# Patient Record
Sex: Female | Born: 1990 | Race: Black or African American | Hispanic: No | Marital: Single | Smoking: Never smoker
Health system: Southern US, Community
[De-identification: ages and names within clinical notes are randomized; demographics above are authoritative.]

## PROBLEM LIST (undated history)

## (undated) DIAGNOSIS — Z8619 Personal history of other infectious and parasitic diseases: Secondary | ICD-10-CM

## (undated) HISTORY — DX: Personal history of other infectious and parasitic diseases: Z86.19

## (undated) HISTORY — PX: NO PAST SURGERIES: SHX2092

---
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
## 2009-02-24 ENCOUNTER — Ambulatory Visit: Payer: Self-pay | Admitting: Internal Medicine

## 2009-02-26 ENCOUNTER — Ambulatory Visit: Payer: Self-pay | Admitting: Internal Medicine

## 2009-12-22 ENCOUNTER — Emergency Department (HOSPITAL_COMMUNITY): Admission: EM | Admit: 2009-12-22 | Discharge: 2009-12-22 | Payer: Self-pay | Admitting: Family Medicine

## 2010-05-13 LAB — URINALYSIS, ROUTINE W REFLEX MICROSCOPIC
Bilirubin Urine: NEGATIVE
Glucose, UA: NEGATIVE mg/dL
Hgb urine dipstick: NEGATIVE
Ketones, ur: NEGATIVE mg/dL
Nitrite: NEGATIVE
Protein, ur: NEGATIVE mg/dL
Specific Gravity, Urine: 1.021 (ref 1.005–1.030)
Urobilinogen, UA: 0.2 mg/dL (ref 0.0–1.0)
pH: 8 (ref 5.0–8.0)

## 2010-05-13 LAB — WET PREP, GENITAL
Trich, Wet Prep: NONE SEEN
Yeast Wet Prep HPF POC: NONE SEEN

## 2010-05-13 LAB — GC/CHLAMYDIA PROBE AMP, GENITAL
Chlamydia, DNA Probe: NEGATIVE
GC Probe Amp, Genital: NEGATIVE

## 2010-05-13 LAB — POCT PREGNANCY, URINE: Preg Test, Ur: NEGATIVE

## 2010-05-13 LAB — URINE MICROSCOPIC-ADD ON

## 2010-05-14 LAB — URINE CULTURE: Colony Count: 15000

## 2010-05-14 LAB — WET PREP, GENITAL
Trich, Wet Prep: NONE SEEN
Yeast Wet Prep HPF POC: NONE SEEN

## 2010-05-14 LAB — URINALYSIS, ROUTINE W REFLEX MICROSCOPIC
Bilirubin Urine: NEGATIVE
Bilirubin Urine: NEGATIVE
Glucose, UA: NEGATIVE mg/dL
Glucose, UA: NEGATIVE mg/dL
Hgb urine dipstick: NEGATIVE
Hgb urine dipstick: NEGATIVE
Ketones, ur: NEGATIVE mg/dL
Ketones, ur: NEGATIVE mg/dL
Nitrite: NEGATIVE
Nitrite: NEGATIVE
Protein, ur: NEGATIVE mg/dL
Protein, ur: NEGATIVE mg/dL
Specific Gravity, Urine: 1.016 (ref 1.005–1.030)
Specific Gravity, Urine: 1.025 (ref 1.005–1.030)
Urobilinogen, UA: 1 mg/dL (ref 0.0–1.0)
Urobilinogen, UA: 1 mg/dL (ref 0.0–1.0)
pH: 8 (ref 5.0–8.0)
pH: 7 (ref 5.0–8.0)

## 2010-05-14 LAB — URINE MICROSCOPIC-ADD ON

## 2010-05-14 LAB — GC/CHLAMYDIA PROBE AMP, GENITAL
Chlamydia, DNA Probe: POSITIVE — AB
GC Probe Amp, Genital: NEGATIVE

## 2010-05-14 LAB — POCT PREGNANCY, URINE: Preg Test, Ur: NEGATIVE

## 2010-05-23 LAB — POCT URINALYSIS DIP (DEVICE)
Glucose, UA: NEGATIVE mg/dL
Hgb urine dipstick: NEGATIVE
Nitrite: NEGATIVE
Protein, ur: 100 mg/dL — AB
Specific Gravity, Urine: 1.025 (ref 1.005–1.030)
Urobilinogen, UA: 0.2 mg/dL (ref 0.0–1.0)
pH: 5.5 (ref 5.0–8.0)

## 2010-05-23 LAB — POCT I-STAT, CHEM 8
BUN: 9 mg/dL (ref 6–23)
Calcium, Ion: 1.22 mmol/L (ref 1.12–1.32)
Chloride: 105 mEq/L (ref 96–112)
Creatinine, Ser: 0.8 mg/dL (ref 0.4–1.2)
Glucose, Bld: 95 mg/dL (ref 70–99)
HCT: 43 % (ref 36.0–46.0)
Hemoglobin: 14.6 g/dL (ref 12.0–15.0)
Potassium: 4.1 mEq/L (ref 3.5–5.1)
Sodium: 142 mEq/L (ref 135–145)
TCO2: 27 mmol/L (ref 0–100)

## 2010-05-23 LAB — POCT PREGNANCY, URINE: Preg Test, Ur: NEGATIVE

## 2010-05-24 LAB — POCT I-STAT, CHEM 8
BUN: 6 mg/dL (ref 6–23)
Calcium, Ion: 1.15 mmol/L (ref 1.12–1.32)
Chloride: 104 mEq/L (ref 96–112)
Creatinine, Ser: 0.9 mg/dL (ref 0.4–1.2)
Glucose, Bld: 83 mg/dL (ref 70–99)
HCT: 39 % (ref 36.0–46.0)
Hemoglobin: 13.3 g/dL (ref 12.0–15.0)
Potassium: 3.5 mEq/L (ref 3.5–5.1)
Sodium: 140 mEq/L (ref 135–145)
TCO2: 25 mmol/L (ref 0–100)

## 2010-05-24 LAB — CBC
HCT: 40.3 % (ref 36.0–46.0)
Hemoglobin: 13.2 g/dL (ref 12.0–15.0)
MCHC: 32.8 g/dL (ref 30.0–36.0)
MCV: 96 fL (ref 78.0–100.0)
Platelets: 141 10*3/uL — ABNORMAL LOW (ref 150–400)
RBC: 4.2 MIL/uL (ref 3.87–5.11)
RDW: 13.4 % (ref 11.5–15.5)
WBC: 5.9 10*3/uL (ref 4.0–10.5)

## 2010-05-24 LAB — COMPREHENSIVE METABOLIC PANEL
ALT: 15 U/L (ref 0–35)
AST: 18 U/L (ref 0–37)
Albumin: 4.1 g/dL (ref 3.5–5.2)
Alkaline Phosphatase: 51 U/L (ref 39–117)
BUN: 6 mg/dL (ref 6–23)
CO2: 26 mEq/L (ref 19–32)
Calcium: 9.5 mg/dL (ref 8.4–10.5)
Chloride: 107 mEq/L (ref 96–112)
Creatinine, Ser: 0.69 mg/dL (ref 0.4–1.2)
GFR calc Af Amer: >60 mL/min (ref >60)
GFR calc non Af Amer: >60 mL/min (ref >60)
Glucose, Bld: 67 mg/dL — ABNORMAL LOW (ref 70–99)
Potassium: 4 mEq/L (ref 3.5–5.1)
Sodium: 138 mEq/L (ref 135–145)
Total Bilirubin: 1 mg/dL (ref 0.3–1.2)
Total Protein: 6.7 g/dL (ref 6.0–8.3)

## 2010-05-24 LAB — URINALYSIS, ROUTINE W REFLEX MICROSCOPIC
Bilirubin Urine: NEGATIVE
Bilirubin Urine: NEGATIVE
Glucose, UA: NEGATIVE mg/dL
Glucose, UA: NEGATIVE mg/dL
Hgb urine dipstick: NEGATIVE
Hgb urine dipstick: NEGATIVE
Ketones, ur: NEGATIVE mg/dL
Nitrite: NEGATIVE
Nitrite: NEGATIVE
Protein, ur: NEGATIVE mg/dL
Protein, ur: NEGATIVE mg/dL
Specific Gravity, Urine: 1.013 (ref 1.005–1.030)
Specific Gravity, Urine: 1.034 — ABNORMAL HIGH (ref 1.005–1.030)
Urobilinogen, UA: 0.2 mg/dL (ref 0.0–1.0)
Urobilinogen, UA: 1 mg/dL (ref 0.0–1.0)
pH: 6 (ref 5.0–8.0)
pH: 6 (ref 5.0–8.0)

## 2010-05-24 LAB — WET PREP, GENITAL
Trich, Wet Prep: NONE SEEN
Yeast Wet Prep HPF POC: NONE SEEN

## 2010-05-24 LAB — URINE CULTURE: Colony Count: 40000

## 2010-05-24 LAB — GC/CHLAMYDIA PROBE AMP, GENITAL
Chlamydia, DNA Probe: NEGATIVE
GC Probe Amp, Genital: NEGATIVE

## 2010-05-24 LAB — DIFFERENTIAL
Basophils Absolute: 0 10*3/uL (ref 0.0–0.1)
Basophils Relative: 0 % (ref 0–1)
Eosinophils Absolute: 0 10*3/uL (ref 0.0–0.7)
Eosinophils Relative: 0 % (ref 0–5)
Lymphocytes Relative: 22 % (ref 12–46)
Lymphs Abs: 1.3 10*3/uL (ref 0.7–4.0)
Monocytes Absolute: 0.3 10*3/uL (ref 0.1–1.0)
Monocytes Relative: 5 % (ref 3–12)
Neutro Abs: 4.2 10*3/uL (ref 1.7–7.7)
Neutrophils Relative %: 72 % (ref 43–77)

## 2010-05-24 LAB — LIPASE, BLOOD: Lipase: 25 U/L (ref 11–59)

## 2010-05-24 LAB — URINE MICROSCOPIC-ADD ON

## 2010-05-24 LAB — POCT PREGNANCY, URINE
Preg Test, Ur: NEGATIVE
Preg Test, Ur: NEGATIVE

## 2010-07-29 IMAGING — CR DG CHEST 2V
2 series · 2 of 2 positions shown · non-contrast
Comparison: 05/22/2003.

CLINICAL DATA: 18-year-old female with chest pain for 3 days.

CHEST - 2 VIEW

[w chest pa]
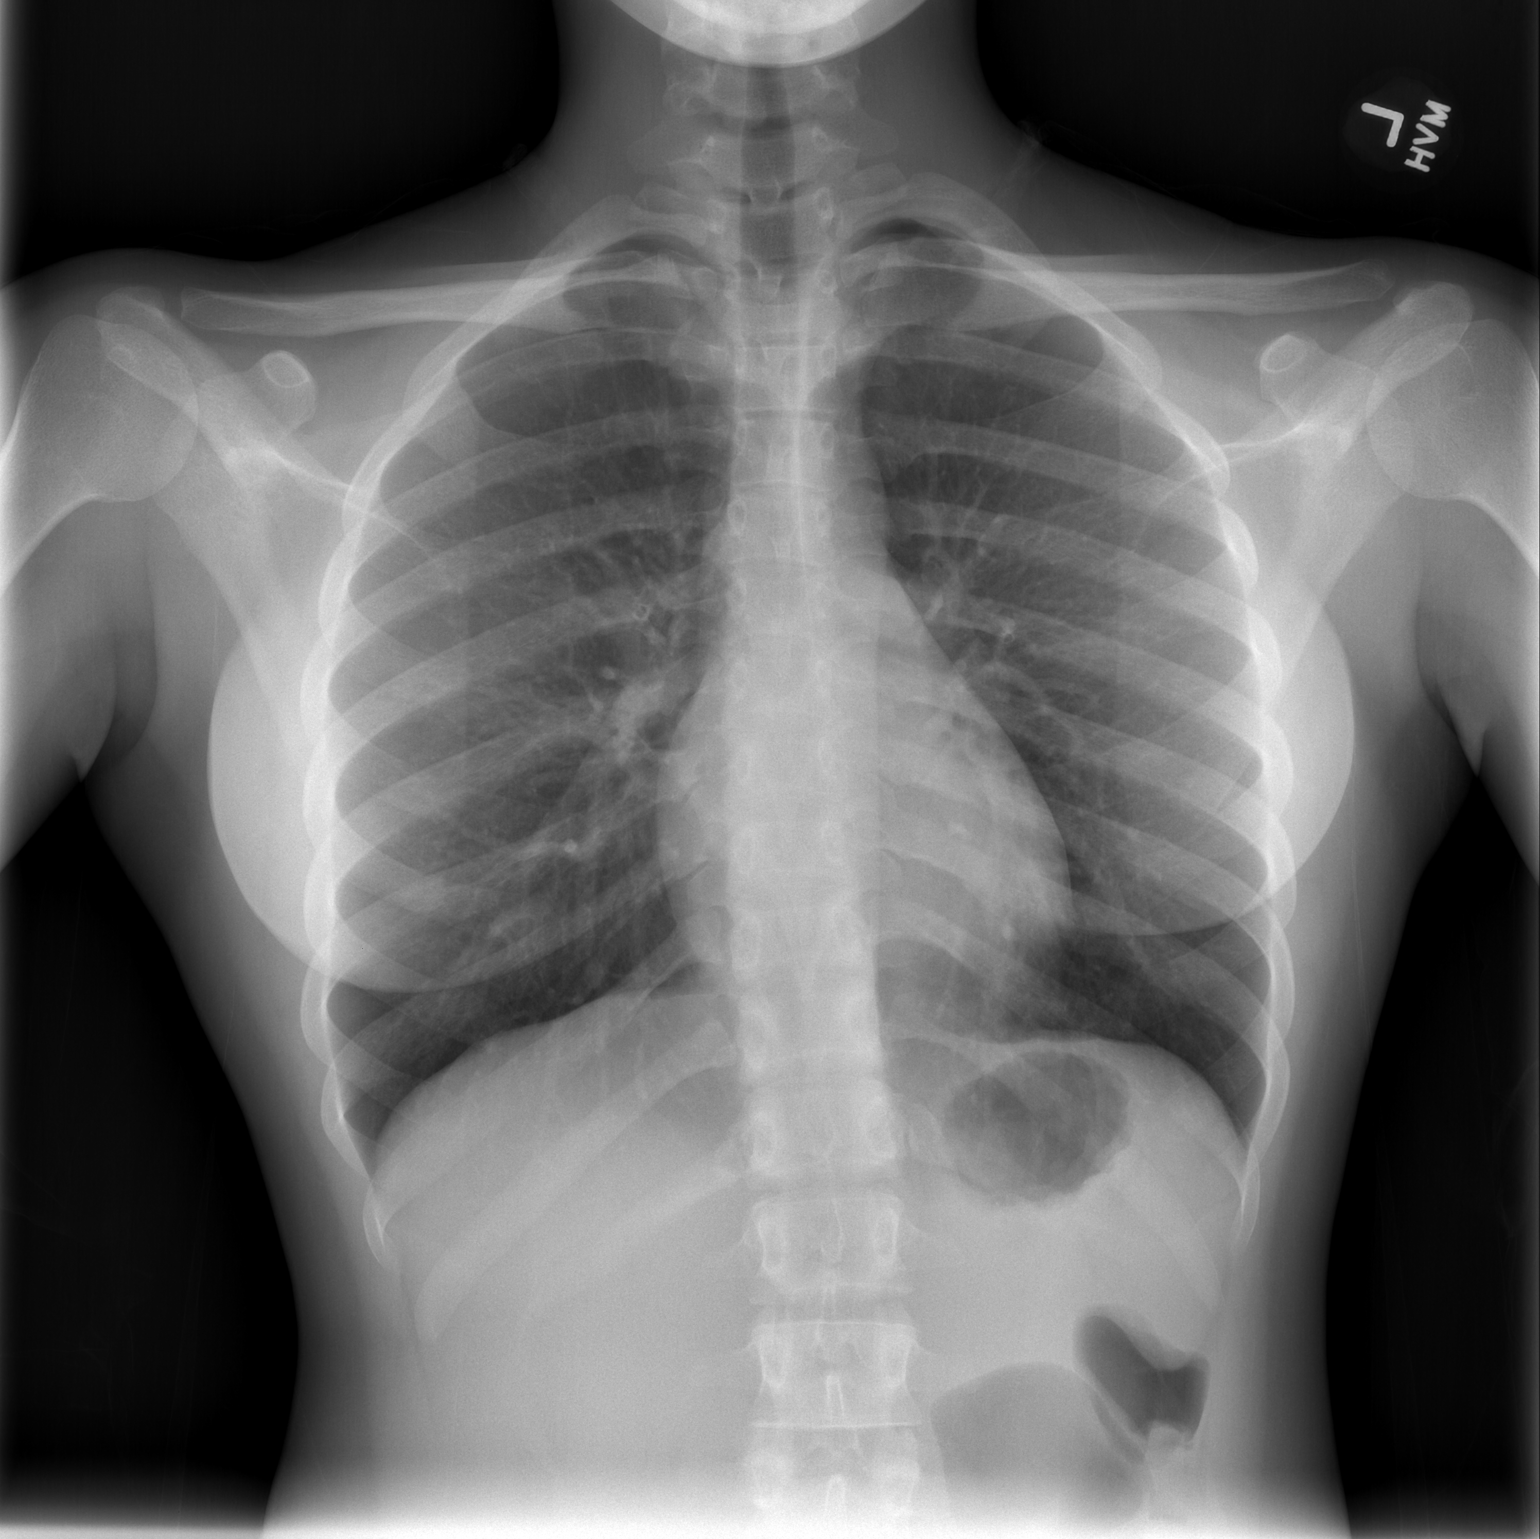

[w chest lat]
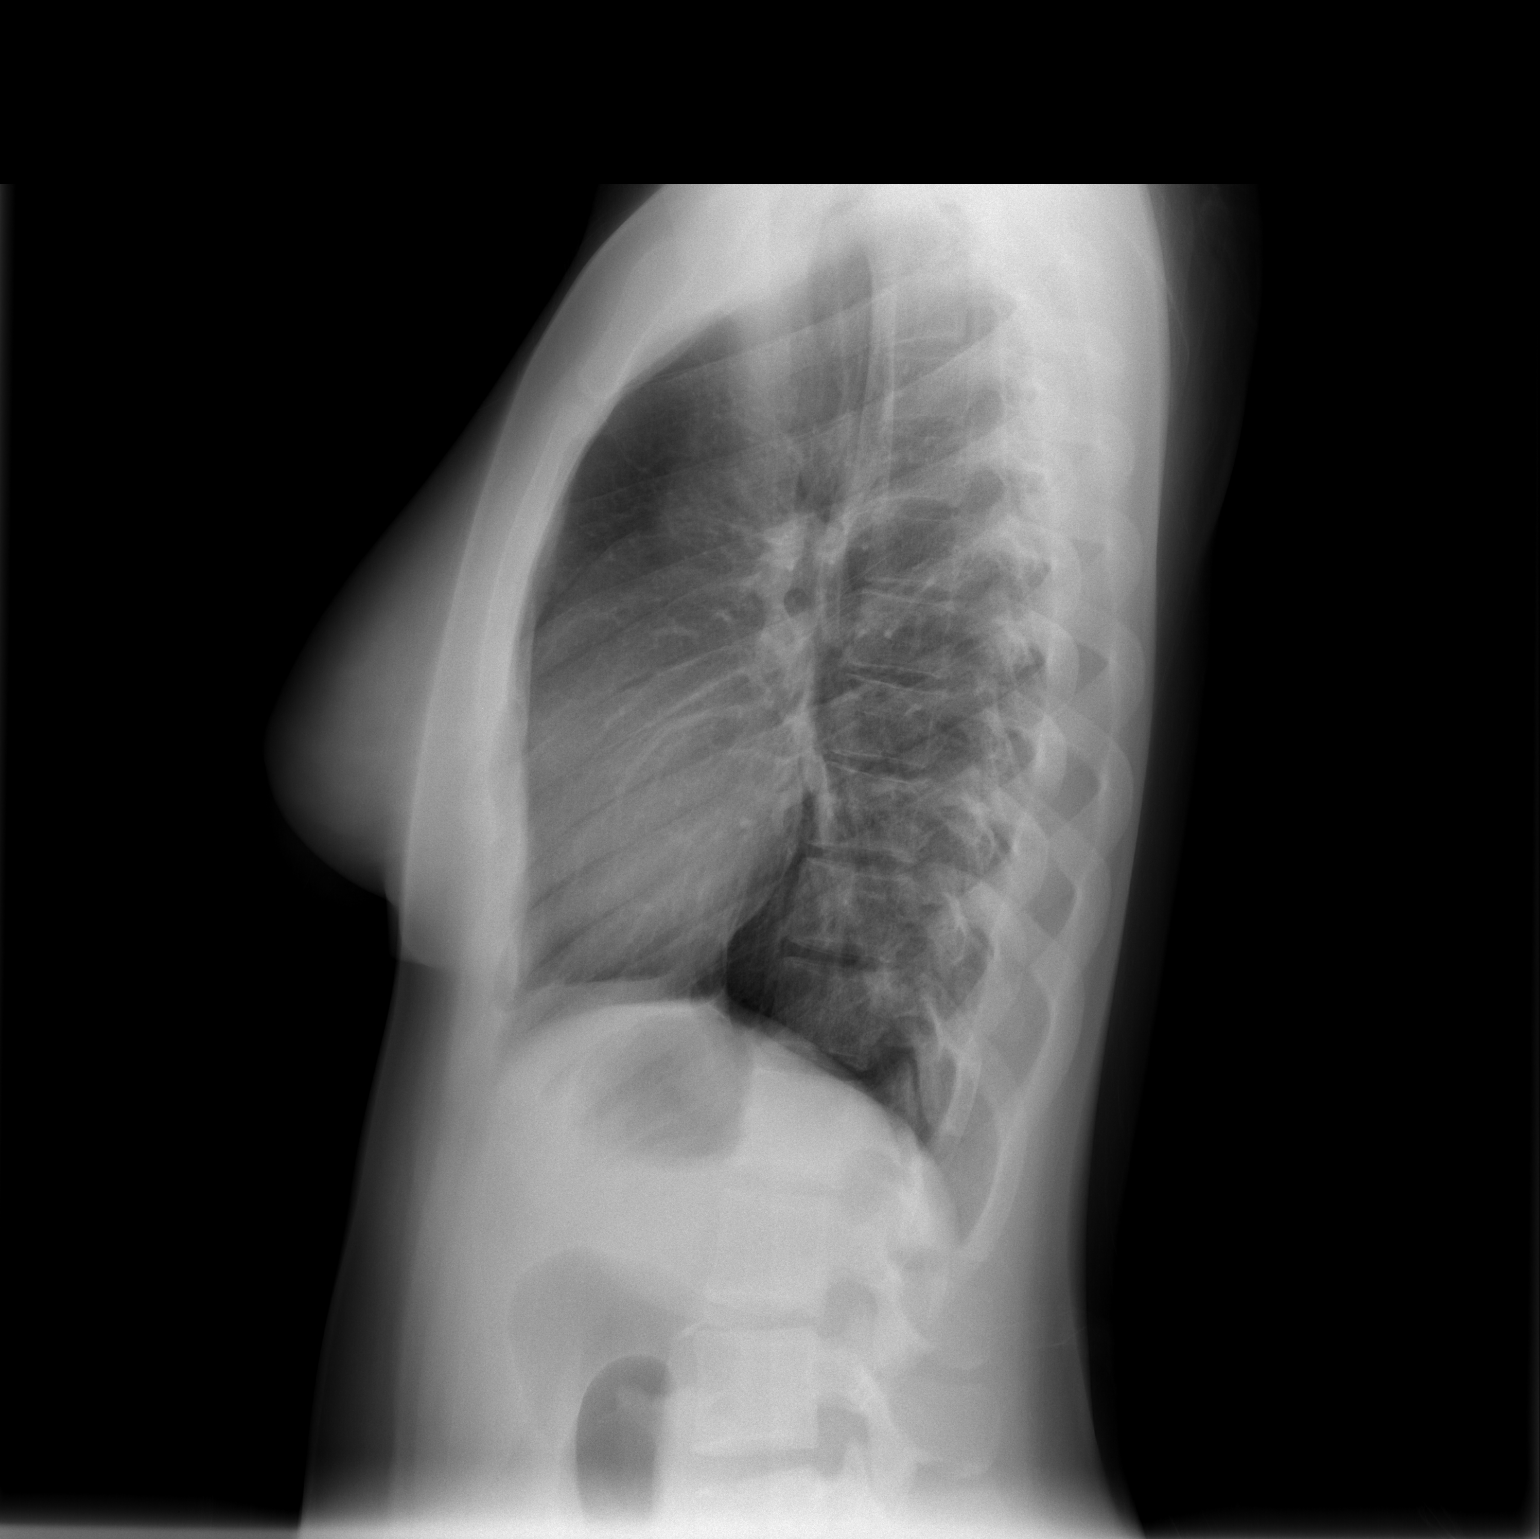

[2 of 2 positions shown; findings below may reference images not displayed]

FINDINGS: Lung volumes are within normal limits.  Cardiac size and
mediastinal contours are within normal limits.  Visualized tracheal
air column is within normal limits.  No pneumothorax.  The lungs
are clear. No acute osseous abnormality identified.
IMPRESSION: No acute cardiopulmonary abnormality.

## 2010-10-07 ENCOUNTER — Inpatient Hospital Stay (HOSPITAL_COMMUNITY)
Admission: AD | Admit: 2010-10-07 | Discharge: 2010-10-08 | Discharge status: Against Medical Advice | Payer: Medicaid Other | Source: Ambulatory Visit | Attending: Obstetrics and Gynecology | Admitting: Obstetrics and Gynecology

## 2010-10-07 DIAGNOSIS — IMO0001 Reserved for inherently not codable concepts without codable children: Secondary | ICD-10-CM | POA: Insufficient documentation

## 2010-10-07 DIAGNOSIS — R51 Headache: Secondary | ICD-10-CM | POA: Insufficient documentation

## 2010-10-08 ENCOUNTER — Emergency Department (HOSPITAL_COMMUNITY)
Admission: EM | Admit: 2010-10-08 | Discharge: 2010-10-08 | Disposition: A | Discharge status: Home / Self Care | Payer: No Typology Code available for payment source | Attending: Emergency Medicine | Admitting: Emergency Medicine

## 2010-10-08 DIAGNOSIS — T1490XA Injury, unspecified, initial encounter: Secondary | ICD-10-CM | POA: Insufficient documentation

## 2010-10-08 DIAGNOSIS — M542 Cervicalgia: Secondary | ICD-10-CM | POA: Insufficient documentation

## 2010-10-08 DIAGNOSIS — R51 Headache: Secondary | ICD-10-CM | POA: Insufficient documentation

## 2010-10-08 DIAGNOSIS — Y92410 Unspecified street and highway as the place of occurrence of the external cause: Secondary | ICD-10-CM | POA: Insufficient documentation

## 2010-10-08 NOTE — ED Notes (Signed)
Pt called, not in lobby. Registration clerk states, saw pt leave in vehicle.

## 2010-10-08 NOTE — ED Notes (Signed)
Pt called, not in lobby 
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
## 2010-11-07 LAB — URINALYSIS, ROUTINE W REFLEX MICROSCOPIC
Bilirubin Urine: NEGATIVE
Glucose, UA: NEGATIVE
Hgb urine dipstick: NEGATIVE
Ketones, ur: 15 — AB
Nitrite: NEGATIVE
Protein, ur: NEGATIVE
Specific Gravity, Urine: 1.008
Urobilinogen, UA: 1
pH: 7

## 2010-11-07 LAB — URINE MICROSCOPIC-ADD ON

## 2011-12-22 ENCOUNTER — Encounter: Payer: Self-pay | Admitting: Internal Medicine

## 2011-12-22 ENCOUNTER — Ambulatory Visit (INDEPENDENT_AMBULATORY_CARE_PROVIDER_SITE_OTHER): Payer: Managed Care, Other (non HMO) | Admitting: Internal Medicine

## 2011-12-22 VITALS — BP 112/84 | HR 70 | Temp 98.8°F | Ht 62.0 in | Wt 137.0 lb

## 2011-12-22 DIAGNOSIS — Z13220 Encounter for screening for lipoid disorders: Secondary | ICD-10-CM

## 2011-12-22 DIAGNOSIS — Z1329 Encounter for screening for other suspected endocrine disorder: Secondary | ICD-10-CM

## 2011-12-22 DIAGNOSIS — Z131 Encounter for screening for diabetes mellitus: Secondary | ICD-10-CM

## 2011-12-22 DIAGNOSIS — Z0000 Encounter for general adult medical examination without abnormal findings: Secondary | ICD-10-CM

## 2011-12-22 DIAGNOSIS — Z130 Encounter for screening for diseases of the blood and blood-forming organs and certain disorders involving the immune mechanism: Secondary | ICD-10-CM

## 2011-12-22 NOTE — Progress Notes (Signed)
HPI  Pt presents to establish care. She has no concerns.  Flu vaccine: never had one Pap smear: 6 months ago-normal Dentist: sees yearly   Past Medical History  Diagnosis Date  . History of chicken pox     No current outpatient prescriptions on file.    Allergies  Allergen Reactions  . Benadryl (Diphenhydramine Hcl)     Family History  Problem Relation Age of Onset  . Hypertension Mother   . Hypertension Other     Maternal Grandparent  . Heart disease Other     Grandparent  . Alcohol abuse Other     Grandparent  . Stroke Other     Other Blood Relative    History   Social History  . Marital Status: Single    Spouse Name: N/A    Number of Children: 0  . Years of Education: 16   Occupational History  . Shift Supervisor    Social History Main Topics  . Smoking status: Never Smoker   . Smokeless tobacco: Never Used  . Alcohol Use: Yes  . Drug Use: No  . Sexually Active: Not on file   Other Topics Concern  . Not on file   Social History Narrative   Regular exercise-noCaffeine Use-yes   ROS  Constitutional: Denies fever, malaise, fatigue, headache or abrupt weight changes.  HEENT: Denies eye pain, eye redness, ear pain, ringing in the ears, wax buildup, runny nose, nasal congestion, bloody nose, or sore throat. Respiratory: Denies difficulty breathing, shortness of breath, cough or sputum production.   Cardiovascular: Denies chest pain, chest tightness, palpitations or swelling in the hands or feet.  Gastrointestinal: Denies abdominal pain, bloating, constipation, diarrhea or blood in the stool.  GU: Denies frequency, urgency, pain with urination, blood in urine, odor or discharge. Musculoskeletal: Denies decrease in range of motion, difficulty with gait, muscle pain or joint pain and swelling.  Skin: Denies redness, rashes, lesions or ulcercations.  Neurological: Denies dizziness, difficulty with memory, difficulty with speech or problems with balance  and coordination.   No other specific complaints in a complete review of systems (except as listed in HPI above).   BP 112/84  Pulse 70  Temp 98.8 F (37.1 C) (Oral)  Ht 5\' 2"  (1.575 m)  Wt 137 lb (62.143 kg)  BMI 25.06 kg/m2  SpO2 96%  LMP 12/01/2011 Wt Readings from Last 3 Encounters:  12/22/11 137 lb (62.143 kg)  10/08/10 130 lb (58.968 kg)   PE   General: Appears her stated age, well developed, well nourished in NAD. HEENT: Head: normal shape and size; Eyes: sclera white, no icterus, conjunctiva pink, PERRLA and EOMs intact; Ears: Tm's gray and intact, normal light reflex; Nose: mucosa pink and moist, septum midline; Throat/Mouth: Teeth present, mucosa pink and moist, no lesions or ulcerations noted.  Neck: Normal range of motion. Neck supple, trachea midline. No massses, lumps or thyromegaly present.  Cardiovascular: Normal rate and rhythm. S1,S2 noted.  No murmur, rubs or gallops noted. No JVD or BLE edema. No carotid bruits noted. Pulmonary/Chest: Normal effort and positive vesicular breath sounds. No respiratory distress. No wheezes, rales or ronchi noted.  Abdomen: Soft and nontender. Normal bowel sounds, no bruits noted. No distention or masses noted. Liver, spleen and kidneys non palpable. Musculoskeletal: Normal range of motion. No signs of joint swelling. No difficulty with gait.  Neurological: Alert and oriented. Cranial nerves II-XII intact. Coordination normal. +DTRs bilaterally. Psychiatric: Mood and affect normal. Behavior is normal. Judgment and thought content  normal.    Assessment and plan  Preventative Health maintenance  Obtain basic labs today: CBC, BMET, TSH, lipids Pt will think about flu vaccine Pt will think about HPV vaccine Will schedule OV for pap smear

## 2011-12-22 NOTE — Patient Instructions (Addendum)
Health Maintenance, 18- to 21-Year-Old SCHOOL PERFORMANCE After high school completion, the young adult may be attending college, technical or vocational school, or entering the military or the work force. SOCIAL AND EMOTIONAL DEVELOPMENT The young adult establishes adult relationships and explores sexual identity. Young adults may be living at home or in a college dorm or apartment. Increasing independence is important with young adults. Throughout adolescence, teens should assume responsibility of their own health care. IMMUNIZATIONS Most young adults should be fully vaccinated. A booster dose of Tdap (tetanus, diphtheria, and pertussis, or "whooping cough"), a dose of meningococcal vaccine to protect against a certain type of bacterial meningitis, hepatitis A, human papillomarvirus (HPV), chickenpox, or measles vaccines may be indicated, if not given at an earlier age. Annual influenza or "flu" vaccination should be considered during flu season.   TESTING Annual screening for vision and hearing problems is recommended. Vision should be screened objectively at least once between 18 and 21 years of age. The young adult may be screened for anemia or tuberculosis. Young adults should have a blood test to check for high cholesterol during this time period. Young adults should be screened for use of alcohol and drugs. If the young adult is sexually active, screening for sexually transmitted infections, pregnancy, or HIV may be performed. Screening for cervical cancer should be performed within 3 years of beginning sexual activity. NUTRITION AND ORAL HEALTH  Adequate calcium intake is important. Consume 3 servings of low-fat milk and dairy products daily. For those who do not drink milk or consume dairy products, calcium enriched foods, such as juice, bread, or cereal, dark, leafy greens, or canned fish are alternate sources of calcium.   Drink plenty of water. Limit fruit juice to 8 to 12 ounces per day.  Avoid sugary beverages or sodas.   Discourage skipping meals, especially breakfast. Teens should eat a good variety of vegetables and fruits, as well as lean meats.   Avoid high fat, high salt, and high sugar foods, such as candy, chips, and cookies.   Encourage young adults to participate in meal planning and preparation.   Eat meals together as a family whenever possible. Encourage conversation at mealtime.   Limit fast food choices and eating out at restaurants.   Brush teeth twice a day and floss.   Schedule dental exams twice a year.  SLEEP Regular sleep habits are important. PHYSICAL, SOCIAL, AND EMOTIONAL DEVELOPMENT  One hour of regular physical activity daily is recommended. Continue to participate in sports.   Encourage young adults to develop their own interests and consider community service or volunteerism.   Provide guidance to the young adult in making decisions about college and work plans.   Make sure that young adults know that they should never be in a situation that makes them uncomfortable, and they should tell partners if they do not want to engage in sexual activity.   Talk to the young adult about body image. Eating disorders may be noted at this time. Young adults may also be concerned about being overweight. Monitor the young adult for weight gain or loss.   Mood disturbances, depression, anxiety, alcoholism, or attention problems may be noted in young adults. Talk to the caregiver if there are concerns about mental illness.   Negotiate limit setting and independent decision making.   Encourage the young adult to handle conflict without physical violence.   Avoid loud noises which may impair hearing.   Limit television and computer time to 2 hours per   day. Individuals who engage in excessive sedentary activity are more likely to become overweight.  RISK BEHAVIORS  Sexually active young adults need to take precautions against pregnancy and sexually  transmitted infections. Talk to young adults about contraception.   Provide a tobacco-free and drug-free environment for the young adult. Talk to the young adult about drug, tobacco, and alcohol use among friends or at friends' homes. Make sure the young adult knows that smoking tobacco or marijuana and taking drugs have health consequences and may impact brain development.   Teach the young adult about appropriate use of over-the-counter or prescription medicines.   Establish guidelines for driving and for riding with friends.   Talk to young adults about the risks of drinking and driving or boating. Encourage the young adult to call you if he or she or friends have been drinking or using drugs.   Remind young adults to wear seat belts at all times in cars and life vests in boats.   Young adults should always wear a properly fitted helmet when they are riding a bicycle.   Use caution with all-terrain vehicles (ATVs) or other motorized vehicles.   Do not keep handguns in the home. (If you do, the gun and ammunition should be locked separately and out of the young adult's access.)   Equip your home with smoke detectors and change the batteries regularly. Make sure all family members know the fire escape plans for your home.   Teach young adults not to swim alone and not to dive in shallow water.   All individuals should wear sunscreen that protects against UVA and UVB light with at least a sun protection factor (SPF) of 30 when out in the sun. This minimizes sun burning.  WHAT'S NEXT? Young adults should visit their pediatrician or family physician yearly. By young adulthood, health care should be transitioned to a family physician or internal medicine specialist. Sexually active females may want to begin annual physical exams with a gynecologist. Document Released: 04/20/2006 Document Revised: 04/17/2011 Document Reviewed: 05/10/2006 [name] Patient Information 2013 [place], [place].    Health Maintenance, Females A healthy lifestyle and preventative care can promote health and wellness.  Maintain regular health, dental, and eye exams.   Eat a healthy diet. Foods like vegetables, fruits, whole grains, low-fat dairy products, and lean protein foods contain the nutrients you need without too many calories. Decrease your intake of foods high in solid fats, added sugars, and salt. Get information about a proper diet from your caregiver, if necessary.   Regular physical exercise is one of the most important things you can do for your health. Most adults should get at least 150 minutes of moderate-intensity exercise (any activity that increases your heart rate and causes you to sweat) each week. In addition, most adults need muscle-strengthening exercises on 2 or more days a week.     Maintain a healthy weight. The body mass index (BMI) is a screening tool to identify possible weight problems. It provides an estimate of body fat based on height and weight. Your caregiver can help determine your BMI, and can help you achieve or maintain a healthy weight. For adults 20 years and older:   A BMI below 18.5 is considered underweight.   A BMI of 18.5 to 24.9 is normal.   A BMI of 25 to 29.9 is considered overweight.   A BMI of 30 and above is considered obese.   Maintain normal blood lipids and cholesterol by exercising and minimizing  your intake of saturated fat. Eat a balanced diet with plenty of fruits and vegetables. Blood tests for lipids and cholesterol should begin at age 31 and be repeated every 5 years. If your lipid or cholesterol levels are high, you are over 50, or you are a high risk for heart disease, you may need your cholesterol levels checked more frequently. Ongoing high lipid and cholesterol levels should be treated with medicines if diet and exercise are not effective.   If you smoke, find out from your caregiver how to quit. If you do not use tobacco, do not start.    If you are pregnant, do not drink alcohol. If you are breastfeeding, be very cautious about drinking alcohol. If you are not pregnant and choose to drink alcohol, do not exceed 1 drink per day. One drink is considered to be 12 ounces (355 mL) of beer, 5 ounces (148 mL) of wine, or 1.5 ounces (44 mL) of liquor.   Avoid use of street drugs. Do not share needles with anyone. Ask for help if you need support or instructions about stopping the use of drugs.   High blood pressure causes heart disease and increases the risk of stroke. Blood pressure should be checked at least every 1 to 2 years. Ongoing high blood pressure should be treated with medicines, if weight loss and exercise are not effective.   If you are 39 to 22 years old, ask your caregiver if you should take aspirin to prevent strokes.   Diabetes screening involves taking a blood sample to check your fasting blood sugar level. This should be done once every 3 years, after age 41, if you are within normal weight and without risk factors for diabetes. Testing should be considered at a younger age or be carried out more frequently if you are overweight and have at least 1 risk factor for diabetes.   Breast cancer screening is essential preventative care for women. You should practice "breast self-awareness." This means understanding the normal appearance and feel of your breasts and may include breast self-examination. Any changes detected, no matter how small, should be reported to a caregiver. Women in their 63s and 30s should have a clinical breast exam (CBE) by a caregiver as part of a regular health exam every 1 to 3 years. After age 72, women should have a CBE every year. Starting at age 22, women should consider having a mammogram (breast X-ray) every year. Women who have a family history of breast cancer should talk to their caregiver about genetic screening. Women at a high risk of breast cancer should talk to their caregiver about having  an MRI and a mammogram every year.   The Pap test is a screening test for cervical cancer. Women should have a Pap test starting at age 74. Between ages 35 and 43, Pap tests should be repeated every 2 years. Beginning at age 81, you should have a Pap test every 3 years as long as the past 3 Pap tests have been normal. If you had a hysterectomy for a problem that was not cancer or a condition that could lead to cancer, then you no longer need Pap tests. If you are between ages 57 and 32, and you have had normal Pap tests going back 10 years, you no longer need Pap tests. If you have had past treatment for cervical cancer or a condition that could lead to cancer, you need Pap tests and screening for cancer for at least 20  years after your treatment. If Pap tests have been discontinued, risk factors (such as a new sexual partner) need to be reassessed to determine if screening should be resumed. Some women have medical problems that increase the chance of getting cervical cancer. In these cases, your caregiver may recommend more frequent screening and Pap tests.   The human papillomavirus (HPV) test is an additional test that may be used for cervical cancer screening. The HPV test looks for the virus that can cause the cell changes on the cervix. The cells collected during the Pap test can be tested for HPV. The HPV test could be used to screen women aged 40 years and older, and should be used in women of any age who have unclear Pap test results. After the age of 72, women should have HPV testing at the same frequency as a Pap test.   Colorectal cancer can be detected and often prevented. Most routine colorectal cancer screening begins at the age of 16 and continues through age 72. However, your caregiver may recommend screening at an earlier age if you have risk factors for colon cancer. On a yearly basis, your caregiver may provide home test kits to check for hidden blood in the stool. Use of a small camera at  the end of a tube, to directly examine the colon (sigmoidoscopy or colonoscopy), can detect the earliest forms of colorectal cancer. Talk to your caregiver about this at age 20, when routine screening begins. Direct examination of the colon should be repeated every 5 to 10 years through age 94, unless early forms of pre-cancerous polyps or small growths are found.   Hepatitis C blood testing is recommended for all people born from [birth year] through 1966 and any individual with known risks for hepatitis C.   Practice safe sex. Use condoms and avoid high-risk sexual practices to reduce the spread of sexually transmitted infections (STIs). Sexually active women aged 19 and younger should be checked for Chlamydia, which is a common sexually transmitted infection. Older women with new or multiple partners should also be tested for Chlamydia. Testing for other STIs is recommended if you are sexually active and at increased risk.   Osteoporosis is a disease in which the bones lose minerals and strength with aging. This can result in serious bone fractures. The risk of osteoporosis can be identified using a bone density scan. Women ages 5 and over and women at risk for fractures or osteoporosis should discuss screening with their caregivers. Ask your caregiver whether you should be taking a calcium supplement or vitamin D to reduce the rate of osteoporosis.   Menopause can be associated with physical symptoms and risks. Hormone replacement therapy is available to decrease symptoms and risks. You should talk to your caregiver about whether hormone replacement therapy is right for you.   Use sunscreen with a sun protection factor (SPF) of 30 or greater. Apply sunscreen liberally and repeatedly throughout the day. You should seek shade when your shadow is shorter than you. Protect yourself by wearing long sleeves, pants, a wide-brimmed hat, and sunglasses year round, whenever you are outdoors.   Notify your caregiver  of new moles or changes in moles, especially if there is a change in shape or color. Also notify your caregiver if a mole is larger than the size of a pencil eraser.   Stay current with your immunizations.  Document Released: 08/08/2010 Document Revised: 04/17/2011 Document Reviewed: 08/08/2010 [facility] Patient Information 2013 [place], [place].   Breast Self-Exam  A self breast exam may help you find changes or problems while they are still small. Do a breast self-exam:  Every month.   One week after your period (menstrual period).   On the first day of each month if you do not have periods anymore.  Look for any:  Change in breast color, size, or shape.   Dimples in your breast.   Changes in your nipples or skin.   Dry skin on your breasts or nipples.   Watery or bloody discharge from your nipples.   Feel for:  Lumps.   Thick, hard places.   Any other changes.  HOME CARE There are 3 ways to do the breast self-exam: In front of a mirror.  Lift your arms over your head and turn side to side.   Put your hands on your hips and lean down, then turn from side to side.   Bend forward and turn from side to side.  In the shower.  With soapy hands, check both breasts. Then check above and below your collarbone and your armpits.   Feel above and below your collarbone down to under your breast, and from the center of your chest to the outer edge of the armpit. Check for any lumps or hard spots.   Using the tips of your middle three fingers check your whole breast by pressing your hand over your breast in a circle or in an up and down motion.  Lying down.  Lie flat on your bed.   Put a small pillow under the breast you are going to check. On that same side, put your hand behind your head.   With your other hand, use the 3 middle fingers to feel the breast.   Move your fingers in a circle around the breast. Press firmly over all parts of the breast to feel for any lumps.    GET HELP RIGHT AWAY IF: You find any changes in your breasts so they can be checked. Document Released: 07/12/2007 Document Revised: 04/17/2011 Document Reviewed: 05/13/2008 [facility] Patient Information 2013 [place], [place].

## 2011-12-29 ENCOUNTER — Ambulatory Visit: Payer: Managed Care, Other (non HMO) | Admitting: Internal Medicine

## 2012-02-12 ENCOUNTER — Encounter: Payer: Managed Care, Other (non HMO) | Admitting: Internal Medicine

## 2012-02-19 ENCOUNTER — Encounter: Payer: Self-pay | Admitting: Internal Medicine

## 2012-02-19 ENCOUNTER — Ambulatory Visit (INDEPENDENT_AMBULATORY_CARE_PROVIDER_SITE_OTHER): Payer: Managed Care, Other (non HMO) | Admitting: Internal Medicine

## 2012-02-19 ENCOUNTER — Other Ambulatory Visit (INDEPENDENT_AMBULATORY_CARE_PROVIDER_SITE_OTHER): Payer: Managed Care, Other (non HMO)

## 2012-02-19 VITALS — BP 102/60 | HR 98 | Temp 99.0°F | Resp 16 | Wt 140.0 lb

## 2012-02-19 DIAGNOSIS — Z130 Encounter for screening for diseases of the blood and blood-forming organs and certain disorders involving the immune mechanism: Secondary | ICD-10-CM

## 2012-02-19 DIAGNOSIS — Z131 Encounter for screening for diabetes mellitus: Secondary | ICD-10-CM

## 2012-02-19 DIAGNOSIS — Z1329 Encounter for screening for other suspected endocrine disorder: Secondary | ICD-10-CM

## 2012-02-19 DIAGNOSIS — Z13220 Encounter for screening for lipoid disorders: Secondary | ICD-10-CM

## 2012-02-19 DIAGNOSIS — Z0000 Encounter for general adult medical examination without abnormal findings: Secondary | ICD-10-CM

## 2012-02-19 DIAGNOSIS — Z124 Encounter for screening for malignant neoplasm of cervix: Secondary | ICD-10-CM

## 2012-02-19 LAB — BASIC METABOLIC PANEL
BUN: 7 mg/dL (ref 6–23)
CO2: 28 mEq/L (ref 19–32)
Calcium: 9.2 mg/dL (ref 8.4–10.5)
Chloride: 106 mEq/L (ref 96–112)
Creatinine, Ser: 0.7 mg/dL (ref 0.4–1.2)
GFR: 145.44 mL/min (ref >60.00)
Glucose, Bld: 90 mg/dL (ref 70–99)
Potassium: 3.9 mEq/L (ref 3.5–5.1)
Sodium: 140 mEq/L (ref 135–145)

## 2012-02-19 LAB — CBC
HCT: 37.7 % (ref 36.0–46.0)
Hemoglobin: 12.4 g/dL (ref 12.0–15.0)
MCHC: 32.9 g/dL (ref 30.0–36.0)
MCV: 93.3 fl (ref 78.0–100.0)
Platelets: 147 10*3/uL — ABNORMAL LOW (ref 150.0–400.0)
RBC: 4.04 Mil/uL (ref 3.87–5.11)
RDW: 13.3 % (ref 11.5–14.6)
WBC: 6.7 10*3/uL (ref 4.5–10.5)

## 2012-02-19 LAB — LIPID PANEL
Cholesterol: 125 mg/dL (ref 0–200)
HDL: 48.1 mg/dL (ref >39.00)
LDL Cholesterol: 69 mg/dL (ref 0–99)
Total CHOL/HDL Ratio: 3
Triglycerides: 42 mg/dL (ref 0.0–149.0)
VLDL: 8.4 mg/dL (ref 0.0–40.0)

## 2012-02-19 LAB — TSH: TSH: 0.95 u[IU]/mL (ref 0.35–5.50)

## 2012-02-19 NOTE — Progress Notes (Signed)
  Subjective:    Patient ID: Margaret Calderon, female    DOB: Jul 26, 1990, 22 y.o.   MRN: [id number]  HPI  Pt presents to the clinic today for her pap smear. Her las pap smear was in 2011 and normal. She has no concerns. She is having some abdominal cramping as if her period is about to start.  Review of Systems  Past Medical History  Diagnosis Date  . History of chicken pox     No current outpatient prescriptions on file.    Allergies  Allergen Reactions  . Benadryl (Diphenhydramine Hcl)     Family History  Problem Relation Age of Onset  . Hypertension Mother   . Hypertension Other     Maternal Grandparent  . Heart disease Other     Grandparent  . Alcohol abuse Other     Grandparent  . Stroke Other     Other Blood Relative    History   Social History  . Marital Status: Single    Spouse Name: N/A    Number of Children: 0  . Years of Education: 16   Occupational History  . Shift Supervisor    Social History Main Topics  . Smoking status: Never Smoker   . Smokeless tobacco: Never Used  . Alcohol Use: Yes  . Drug Use: No  . Sexually Active: Not on file   Other Topics Concern  . Not on file   Social History Narrative   Regular exercise-noCaffeine Use-yes     Constitutional: Denies fever, malaise, fatigue, headache or abrupt weight changes.  GU: Denies urgency, frequency, pain with urination, burning sensation, blood in urine, vaginal odor or discharge.    No other specific complaints in a complete review of systems (except as listed in HPI above).     Objective:   Physical Exam   Constitutional:  Alert, oriented x 4, well developed, well nourished in no apparent distress. .  Cardiovascular: Normal rate and rhythm. S1,S2 noted.  No murmur, rubs or gallops noted. No JVD or BLE edema. No carotid bruits noted. Pulmonary/Chest: Normal effort and positive vesicular breath sounds. No respiratory distress. No wheezes, rales or ronchi noted.  Genitourinary:  Normal female anatomy. Uterus midline, anterior and soft. No CMT noted.  Small amount of bloody discharge noted. Adenexa non palpable. Breast without lumps or masses.  ntent normal.      Assessment & Plan:   Gynecological exam  Will send off for pap cytology May need to repeat due to you being on your menses Continue to use condoms with sexual intercourse  RTC in 1 year or sooner if needed

## 2012-02-19 NOTE — Patient Instructions (Signed)

## 2012-02-26 ENCOUNTER — Telehealth: Payer: Self-pay | Admitting: Internal Medicine

## 2012-02-26 NOTE — Telephone Encounter (Signed)
The pt called and is hoping to get an rx for a yeast infection.

## 2012-02-27 MED ORDER — FLUCONAZOLE 150 MG PO TABS: 150.0000 mg | ORAL_TABLET | Freq: Once | ORAL | Status: DC

## 2012-02-27 NOTE — Telephone Encounter (Signed)
Rx for Diflucan sent to [facility], pt informed.

## 2012-02-27 NOTE — Telephone Encounter (Signed)
Ash, OK to call in Diflucan 150 mg PO #1 no refills Rene Kocher

## 2012-03-04 ENCOUNTER — Other Ambulatory Visit (HOSPITAL_COMMUNITY)
Admission: RE | Admit: 2012-03-04 | Discharge: 2012-03-04 | Disposition: A | Discharge status: Home / Self Care | Payer: Managed Care, Other (non HMO) | Source: Ambulatory Visit | Attending: Internal Medicine | Admitting: Internal Medicine

## 2012-08-26 ENCOUNTER — Ambulatory Visit (INDEPENDENT_AMBULATORY_CARE_PROVIDER_SITE_OTHER): Payer: Managed Care, Other (non HMO) | Admitting: Internal Medicine

## 2012-08-26 ENCOUNTER — Other Ambulatory Visit: Payer: Managed Care, Other (non HMO)

## 2012-08-26 ENCOUNTER — Encounter: Payer: Self-pay | Admitting: Internal Medicine

## 2012-08-26 VITALS — BP 126/74 | HR 73 | Temp 98.0°F | Wt 149.0 lb

## 2012-08-26 DIAGNOSIS — N898 Other specified noninflammatory disorders of vagina: Secondary | ICD-10-CM

## 2012-08-26 DIAGNOSIS — T781XXA Other adverse food reactions, not elsewhere classified, initial encounter: Secondary | ICD-10-CM

## 2012-08-26 DIAGNOSIS — R220 Localized swelling, mass and lump, head: Secondary | ICD-10-CM

## 2012-08-26 DIAGNOSIS — R221 Localized swelling, mass and lump, neck: Secondary | ICD-10-CM

## 2012-08-26 LAB — WET PREP, GENITAL
Trich, Wet Prep: NONE SEEN
WBC, Wet Prep HPF POC: NONE SEEN
Yeast Wet Prep HPF POC: NONE SEEN

## 2012-08-26 NOTE — Patient Instructions (Signed)
Bacterial Vaginosis Bacterial vaginosis (BV) is a vaginal infection where the normal balance of bacteria in the vagina is disrupted. The normal balance is then replaced by an overgrowth of certain bacteria. There are several different kinds of bacteria that can cause BV. BV is the most common vaginal infection in women of childbearing age. CAUSES   The cause of BV is not fully understood. BV develops when there is an increase or imbalance of harmful bacteria.  Some activities or behaviors can upset the normal balance of bacteria in the vagina and put women at increased risk including:  Having a new sex partner or multiple sex partners.  Douching.  Using an intrauterine device (IUD) for contraception.  It is not clear what role sexual activity plays in the development of BV. However, women that have never had sexual intercourse are rarely infected with BV. Women do not get BV from toilet seats, bedding, swimming pools or from touching objects around them.  SYMPTOMS   Grey vaginal discharge.  A fish-like odor with discharge, especially after sexual intercourse.  Itching or burning of the vagina and vulva.  Burning or pain with urination.  Some women have no signs or symptoms at all. DIAGNOSIS  Your caregiver must examine the vagina for signs of BV. Your caregiver will perform lab tests and look at the sample of vaginal fluid through a microscope. They will look for bacteria and abnormal cells (clue cells), a pH test higher than 4.5, and a positive amine test all associated with BV.  RISKS AND COMPLICATIONS   Pelvic inflammatory disease (PID).  Infections following gynecology surgery.  Developing HIV.  Developing herpes virus. TREATMENT  Sometimes BV will clear up without treatment. However, all women with symptoms of BV should be treated to avoid complications, especially if gynecology surgery is planned. Female partners generally do not need to be treated. However, BV may spread  between female sex partners so treatment is helpful in preventing a recurrence of BV.   BV may be treated with antibiotics. The antibiotics come in either pill or vaginal cream forms. Either can be used with nonpregnant or pregnant women, but the recommended dosages differ. These antibiotics are not harmful to the baby.  BV can recur after treatment. If this happens, a second round of antibiotics will often be prescribed.  Treatment is important for pregnant women. If not treated, BV can cause a premature delivery, especially for a pregnant woman who had a premature birth in the past. All pregnant women who have symptoms of BV should be checked and treated.  For chronic reoccurrence of BV, treatment with a type of prescribed gel vaginally twice a week is helpful. HOME CARE INSTRUCTIONS   Finish all medication as directed by your caregiver.  Do not have sex until treatment is completed.  Tell your sexual partner that you have a vaginal infection. They should see their caregiver and be treated if they have problems, such as a mild rash or itching.  Practice safe sex. Use condoms. Only have 1 sex partner. PREVENTION  Basic prevention steps can help reduce the risk of upsetting the natural balance of bacteria in the vagina and developing BV:  Do not have sexual intercourse (be abstinent).  Do not douche.  Use all of the medicine prescribed for treatment of BV, even if the signs and symptoms go away.  Tell your sex partner if you have BV. That way, they can be treated, if needed, to prevent reoccurrence. SEEK MEDICAL CARE IF:     Your symptoms are not improving after 3 days of treatment.  You have increased discharge, pain, or fever. MAKE SURE YOU:   Understand these instructions.  Will watch your condition.  Will get help right away if you are not doing well or get worse. FOR MORE INFORMATION  Division of STD Prevention (DSTDP), Centers for Disease Control and Prevention:  www.cdc.gov/std American Social Health Association (ASHA): www.ashastd.org  Document Released: 01/23/2005 Document Revised: 04/17/2011 Document Reviewed: 07/16/2008 [employer] Patient Information 2014 [employer].  

## 2012-08-26 NOTE — Progress Notes (Signed)
Subjective:    Patient ID: Margaret Calderon, female    DOB: September 29, 1990, 22 y.o.   MRN: [id number]  HPI  Pt presents to the clinic today with c/o a yeast infection. She has been recurrently getting yeast infections for the past 4 months. She does not douche. She describes the discharge as a thin, white discharge. There is no itching. She has not had abnormal bleeding. She has not taken anything OTC for this. Additionally today, she would like a referral to an allergist. She noticed that when she eats seafood and lots of other foods that her throat feels like it swells. When this occurs, she stops eating the food and drinks lots of water. She has not had Benadryl because she is allergic today. There are no food allergies in her family that she is aware.  Review of Systems      Past Medical History  Diagnosis Date  . History of chicken pox     Current Outpatient Prescriptions  Medication Sig Dispense Refill  . fluconazole (DIFLUCAN) 150 MG tablet Take 1 tablet (150 mg total) by mouth once.  1 tablet  0   No current facility-administered medications for this visit.    Allergies  Allergen Reactions  . Benadryl (Diphenhydramine Hcl)     Family History  Problem Relation Age of Onset  . Hypertension Mother   . Hypertension Other     Maternal Grandparent  . Heart disease Other     Grandparent  . Alcohol abuse Other     Grandparent  . Stroke Other     Other Blood Relative    History   Social History  . Marital Status: Single    Spouse Name: N/A    Number of Children: 0  . Years of Education: 16   Occupational History  . Shift Supervisor    Social History Main Topics  . Smoking status: Never Smoker   . Smokeless tobacco: Never Used  . Alcohol Use: Yes  . Drug Use: No  . Sexually Active: Not on file   Other Topics Concern  . Not on file   Social History Narrative   Regular exercise-no   Caffeine Use-yes     Constitutional: Denies fever, malaise, fatigue,  headache or abrupt weight changes.  HEENT: Pt reports swelling of the throat. Denies eye pain, eye redness, ear pain, ringing in the ears, wax buildup, runny nose, nasal congestion, bloody nose, or sore throat. Respiratory: Denies difficulty breathing, shortness of breath, cough or sputum production.   Cardiovascular: Denies chest pain, chest tightness, palpitations or swelling in the hands or feet.  Gastrointestinal: Denies abdominal pain, bloating, constipation, diarrhea or blood in the stool.  GU: Pt reports vaginal discharge. Denies urgency, frequency, pain with urination, burning sensation, blood in urine, odor.   No other specific complaints in a complete review of systems (except as listed in HPI above).  Objective:   Physical Exam  BP 126/74  Pulse 73  Temp(Src) 98 F (36.7 C) (Oral)  Wt 149 lb (67.586 kg)  BMI 27.25 kg/m2  SpO2 98% Wt Readings from Last 3 Encounters:  08/26/12 149 lb (67.586 kg)  02/19/12 140 lb (63.504 kg)  12/22/11 137 lb (62.143 kg)    General: Appears her stated age, well developed, well nourished in NAD. HEENT: Head: normal shape and size; Eyes: sclera white, no icterus, conjunctiva pink, PERRLA and EOMs intact; Ears: Tm's gray and intact, normal light reflex; Nose: mucosa pink and moist, septum midline; Throat/Mouth: Teeth  present, mucosa pink and moist, no exudate, lesions or ulcerations noted. .  Cardiovascular: Normal rate and rhythm. S1,S2 noted.  No murmur, rubs or gallops noted. No JVD or BLE edema. No carotid bruits noted. Pulmonary/Chest: Normal effort and positive vesicular breath sounds. No respiratory distress. No wheezes, rales or ronchi noted.  Abdomen: Soft and nontender. Normal bowel sounds, no bruits noted. No distention or masses noted. Liver, spleen and kidneys non palpable. Vaginal exam done, no discharge noted.   BMET    Component Value Date/Time   NA 140 02/19/2012 1337   K 3.9 02/19/2012 1337   CL 106 02/19/2012 1337   CO2 28  02/19/2012 1337   GLUCOSE 90 02/19/2012 1337   BUN 7 02/19/2012 1337   CREATININE 0.7 02/19/2012 1337   CALCIUM 9.2 02/19/2012 1337   GFRNONAA >60 04/01/2008 1243   GFRAA  Value: >60        The eGFR has been calculated using the MDRD equation. This calculation has not been validated in all clinical situations. eGFR's persistently <60 mL/min signify possible Chronic Kidney Disease. 04/01/2008 1243    Lipid Panel     Component Value Date/Time   CHOL 125 02/19/2012 1337   TRIG 42.0 02/19/2012 1337   HDL 48.10 02/19/2012 1337   CHOLHDL 3 02/19/2012 1337   VLDL 8.4 02/19/2012 1337   LDLCALC 69 02/19/2012 1337    CBC    Component Value Date/Time   WBC 6.7 02/19/2012 1337   RBC 4.04 02/19/2012 1337   HGB 12.4 02/19/2012 1337   HCT 37.7 02/19/2012 1337   PLT 147.0* 02/19/2012 1337   MCV 93.3 02/19/2012 1337   MCHC 32.9 02/19/2012 1337   RDW 13.3 02/19/2012 1337   LYMPHSABS 1.3 04/01/2008 1243   MONOABS 0.3 04/01/2008 1243   EOSABS 0.0 04/01/2008 1243   BASOSABS 0.0 04/01/2008 1243    Hgb A1C No results found for this basename: HGBA1C         Assessment & Plan:   Vaginal discharge, recurrent:  Will obtain wet prep ? physiological discharge  Food allergies, recurrent:  Will refer to allergist

## 2012-08-27 ENCOUNTER — Telehealth: Payer: Self-pay | Admitting: *Deleted

## 2012-08-27 ENCOUNTER — Other Ambulatory Visit: Payer: Self-pay | Admitting: Internal Medicine

## 2012-08-27 MED ORDER — METRONIDAZOLE 0.75 % VA GEL: 1.0000 | Freq: Two times a day (BID) | VAGINAL | Status: DC

## 2012-08-27 NOTE — Telephone Encounter (Signed)
Pt returned call.  Advised her of Regina's message.

## 2012-09-02 ENCOUNTER — Telehealth: Payer: Self-pay | Admitting: *Deleted

## 2012-09-02 MED ORDER — METRONIDAZOLE 0.75 % VA GEL: 1.0000 | Freq: Two times a day (BID) | VAGINAL | Status: DC

## 2012-09-02 NOTE — Telephone Encounter (Signed)
Ok to refill once more

## 2012-09-02 NOTE — Telephone Encounter (Signed)
Pt called states symptoms have improved but not completely resolved, requesting whether she needs a refill on the Metrogel.  Please advise

## 2012-09-02 NOTE — Telephone Encounter (Signed)
Rx filled, pt notified.

## 2012-09-05 ENCOUNTER — Telehealth: Payer: Self-pay | Admitting: Internal Medicine

## 2012-09-05 NOTE — Telephone Encounter (Signed)
Discussed with pt

## 2012-09-05 NOTE — Telephone Encounter (Signed)
Patient calling to ask if she should use the Metrogel during her period.  Please advise.

## 2012-09-05 NOTE — Telephone Encounter (Signed)
i would wait til after

## 2012-09-10 ENCOUNTER — Telehealth: Payer: Self-pay | Admitting: *Deleted

## 2012-09-10 NOTE — Telephone Encounter (Signed)
Pt called states she is now off her period and is requesting whether she is to continue her medication.  Further states symptoms are resolved.  Please advise

## 2012-09-10 NOTE — Telephone Encounter (Signed)
Spoke with pt advised of msg.

## 2012-09-10 NOTE — Telephone Encounter (Signed)
Ok to just monitor symptoms for now, if they reoccur, ok to go ahead and restart medication

## 2012-09-30 ENCOUNTER — Telehealth: Payer: Self-pay | Admitting: *Deleted

## 2012-09-30 NOTE — Telephone Encounter (Signed)
I would advise her to see a gyn. She does not need a referral

## 2012-09-30 NOTE — Telephone Encounter (Signed)
Pt called states she is still experiencing slight symptoms.  Please advise

## 2012-10-01 ENCOUNTER — Telehealth: Payer: Self-pay | Admitting: *Deleted

## 2012-10-01 NOTE — Telephone Encounter (Signed)
Unable to contact pt, no VM. 

## 2012-10-01 NOTE — Telephone Encounter (Signed)
Spoke with pt advised of Regina's message. 

## 2012-10-21 ENCOUNTER — Other Ambulatory Visit: Payer: Managed Care, Other (non HMO)

## 2012-10-21 ENCOUNTER — Encounter: Payer: Self-pay | Admitting: Internal Medicine

## 2012-10-21 ENCOUNTER — Ambulatory Visit (INDEPENDENT_AMBULATORY_CARE_PROVIDER_SITE_OTHER): Payer: Managed Care, Other (non HMO) | Admitting: Internal Medicine

## 2012-10-21 DIAGNOSIS — T788XXS Other adverse effects, not elsewhere classified, sequela: Secondary | ICD-10-CM

## 2012-10-21 DIAGNOSIS — Z5189 Encounter for other specified aftercare: Secondary | ICD-10-CM

## 2012-10-21 NOTE — Patient Instructions (Addendum)
Order-  Lab- Allergy profile, Food IgE profile, Seafood IgE profile    Dx food allergy

## 2012-10-21 NOTE — Progress Notes (Signed)
10/21/12- 22 yoF never smoker Referral by NP Baity-food allergies-unsure if seasoning or foods itself. Has had some episodes over the last few years which he thought seafood made her throat pH. Chocolate breaks out her mouth. Minimal seasonal allergic rhinitis pattern with sneezing, eyes watering. Denies definite reaction to any specific food, reaction insect stings, asthma, history of skin rash. No ENT surgery. She is unmarried, not pregnant, working as a Merchandiser, retail at [employer]. Rare alcohol. Family members react to seafood with swelling throat, itching and hives.  Prior to Admission medications   Medication Sig Start Date End Date Taking? Authorizing Provider  metroNIDAZOLE (FLAGYL) 500 MG tablet Take 500 mg by mouth 2 (two) times daily.   Yes Historical Provider, MD   Past Medical History  Diagnosis Date  . History of chicken pox    History reviewed. No pertinent past surgical history. Family History  Problem Relation Age of Onset  . Hypertension Mother   . Hypertension Other     Maternal Grandparent  . Heart disease Other     Grandparent  . Alcohol abuse Other     Grandparent  . Stroke Other     Other Blood Relative   History  Substance Use Topics  . Smoking status: Never Smoker   . Smokeless tobacco: Never Used  . Alcohol Use: Yes     Comment: social    History   Social History  . Marital Status: Single    Spouse Name: N/A    Number of Children: 0  . Years of Education: 16   Occupational History  . Shift Supervisor    Social History Main Topics  . Smoking status: Never Smoker   . Smokeless tobacco: Never Used  . Alcohol Use: Yes     Comment: social  . Drug Use: No  . Sexual Activity: Not on file   Other Topics Concern  . Not on file   Social History Narrative   Regular exercise-no   Caffeine Use-yes   ROS-see HPI Constitutional:   No-   weight loss, night sweats, fevers, chills, fatigue, lassitude. HEENT:   No-  headaches, +difficulty swallowing,  tooth/dental problems, +sore throat,       No-  sneezing, itching, ear ache, nasal congestion, post nasal drip,  CV:  No-   chest pain, orthopnea, PND, swelling in lower extremities, anasarca,  dizziness, palpitations Resp: No-   shortness of breath with exertion or at rest.              No-   productive cough,  No non-productive cough,  No- coughing up of blood.              No-   change in color of mucus.  No- wheezing.   Skin: No-   rash or lesions. GI:  No-   heartburn, indigestion, abdominal pain, nausea, vomiting, diarrhea,                 change in bowel habits, loss of appetite GU: No-   dysuria, change in color of urine, no urgency or frequency.  No- flank pain. MS:  No-   joint pain or swelling.  No- decreased range of motion.  No- back pain. Neuro-     nothing unusual Psych:  No- change in mood or affect. No depression or anxiety.  No memory loss.  OBJ- Physical Exam General- Alert, Oriented, Affect-appropriate, Distress- none acute. Well-appearing Skin- rash-none, lesions- none, excoriation- none Lymphadenopathy- none Head- atraumatic  Eyes- Gross vision intact, PERRLA, conjunctivae and secretions clear            Ears- Hearing, canals-normal            Nose- Clear, no-Septal dev, mucus, polyps, erosion, perforation             Throat- Mallampati II , mucosa clear , drainage- none, tonsils- atrophic Neck- flexible , trachea midline, no stridor , thyroid nl, carotid no bruit Chest - symmetrical excursion , unlabored           Heart/CV- RRR , no murmur , no gallop  , no rub, nl s1 s2                           - JVD- none , edema- none, stasis changes- none, varices- none           Lung- clear to P&A, wheeze- none, cough- none , dullness-none, rub- none           Chest wall-  Abd- tender-no, distended-no, bowel sounds-present, HSM- no Br/ Gen/ Rectal- Not done, not indicated Extrem- cyanosis- none, clubbing, none, atrophy- none, strength- nl Neuro- grossly intact to  observation

## 2012-10-22 LAB — ALLERGY FULL PROFILE
Allergen, D pternoyssinus,d7: <0.1 kU/L
Allergen,Goose feathers, e70: <0.1 kU/L
Alternaria Alternata: <0.1 kU/L
Aspergillus fumigatus, m3: <0.1 kU/L
Bahia Grass: <0.1 kU/L
Bermuda Grass: <0.1 kU/L
Box Elder IgE: <0.1 kU/L
Candida Albicans: <0.1 kU/L
Cat Dander: <0.1 kU/L
Common Ragweed: <0.1 kU/L
Curvularia lunata: <0.1 kU/L
D. farinae: <0.1 kU/L
Dog Dander: <0.1 kU/L
Elm IgE: <0.1 kU/L
Fescue: <0.1 kU/L
G005 Rye, Perennial: <0.1 kU/L
G009 Red Top: <0.1 kU/L
Goldenrod: <0.1 kU/L
Helminthosporium halodes: <0.1 kU/L
House Dust Hollister: <0.1 kU/L
Lamb's Quarters: <0.1 kU/L
Oak: <0.1 kU/L
Plantain: <0.1 kU/L
Stemphylium Botryosum: <0.1 kU/L
Sycamore Tree: <0.1 kU/L
Timothy Grass: <0.1 kU/L

## 2012-10-22 LAB — ALLERGEN SEAFOOD PANEL
Clams: <0.1 kU/L
Crab: <0.1 kU/L
Crayfish: <0.1 kU/L
Lobster: <0.1 kU/L
Oyster: <0.1 kU/L
Scallop IgE: <0.1 kU/L
Shrimp IgE: <0.1 kU/L
Tuna IgE: <0.1 kU/L

## 2012-10-22 LAB — ALLERGEN FOOD PROFILE SPECIFIC IGE
Apple: <0.1 kU/L
Chicken IgE: <0.1 kU/L
Corn: <0.1 kU/L
Egg White IgE: <0.1 kU/L
Fish Cod: <0.1 kU/L
IgE (Immunoglobulin E), Serum: 11.1 IU/mL (ref 0.0–180.0)
Milk IgE: <0.1 kU/L
Orange: <0.1 kU/L
Peanut IgE: <0.1 kU/L
Shrimp IgE: <0.1 kU/L
Soybean IgE: <0.1 kU/L
Tomato IgE: <0.1 kU/L
Tuna IgE: <0.1 kU/L
Wheat IgE: <0.1 kU/L

## 2012-10-22 NOTE — Progress Notes (Signed)
Quick Note:  Informed pt of lab results and she verbalized understanding and has no further questions or concerns at this time ______ 

## 2012-10-27 DIAGNOSIS — Z91018 Allergy to other foods: Secondary | ICD-10-CM | POA: Insufficient documentation

## 2012-10-27 NOTE — Assessment & Plan Note (Signed)
Relation to symptoms seems pretty indefinite period Plan-allergy profiles including food IgE and seafood IgE. Avoid foods that definitely cause problems. Consider a food diary

## 2012-12-09 ENCOUNTER — Ambulatory Visit: Payer: Managed Care, Other (non HMO) | Admitting: Internal Medicine

## 2012-12-25 ENCOUNTER — Ambulatory Visit (INDEPENDENT_AMBULATORY_CARE_PROVIDER_SITE_OTHER): Payer: Managed Care, Other (non HMO)

## 2012-12-25 DIAGNOSIS — Z111 Encounter for screening for respiratory tuberculosis: Secondary | ICD-10-CM | POA: Diagnosis not present

## 2012-12-27 LAB — TB SKIN TEST
Induration: NEGATIVE mm
TB Skin Test: NEGATIVE

## 2013-01-31 ENCOUNTER — Ambulatory Visit (INDEPENDENT_AMBULATORY_CARE_PROVIDER_SITE_OTHER): Payer: Managed Care, Other (non HMO) | Admitting: Internal Medicine

## 2013-01-31 ENCOUNTER — Encounter: Payer: Self-pay | Admitting: Internal Medicine

## 2013-01-31 VITALS — BP 118/82 | HR 70 | Temp 97.7°F | Wt 159.8 lb

## 2013-01-31 DIAGNOSIS — L500 Allergic urticaria: Secondary | ICD-10-CM

## 2013-01-31 MED ORDER — PREDNISONE (PAK) 10 MG PO TABS: ORAL_TABLET | ORAL | Status: DC

## 2013-01-31 NOTE — Progress Notes (Signed)
   Subjective:    Patient ID: Margaret Calderon, female    DOB: 07/27/1990, 22 y.o.   MRN: [id number]  Allergic Reaction Pertinent negatives include no chest pain or coughing.    Complains of rash on face Onset 72 hours ago Uncertainty new exposures that reviewed potential for seafood allergy and prior allergist evaluation for same Current symptoms affecting face and anterior neck,  Past Medical History  Diagnosis Date  . History of chicken pox     Review of Systems  Constitutional: Negative for fever, chills, activity change, appetite change, fatigue and unexpected weight change.  Respiratory: Negative for cough and shortness of breath.   Cardiovascular: Negative for chest pain and leg swelling.       Objective:   Physical Exam  BP 118/82  Pulse 70  Temp(Src) 97.7 F (36.5 C) (Oral)  Wt 159 lb 12.8 oz (72.485 kg)  SpO2 98% Wt Readings from Last 3 Encounters:  01/31/13 159 lb 12.8 oz (72.485 kg)  10/21/12 152 lb 3.2 oz (69.037 kg)  08/26/12 149 lb (67.586 kg)   Constitutional: She appears well-developed and well-nourished. No distress.  HENT: Head: Normocephalic and atraumatic. Ears: B TMs ok, no erythema or effusion; Nose: Nose normal. Mouth/Throat: Oropharynx is clear and moist. No oropharyngeal exudate.  Eyes: Conjunctivae and EOM are normal. Pupils are equal, round, and reactive to light. No scleral icterus.  Neck: Normal range of motion. Neck supple. No JVD or LAD present. No thyromegaly present.  Cardiovascular: Normal rate, regular rhythm and normal heart sounds.  No murmur heard. No BLE edema. Pulmonary/Chest: Effort normal and breath sounds normal. No respiratory distress. She has no wheezes.  Skin: fine urticaria on face bilateral cheeks, forehead and chin as well as anterior neck. Minimal associated soft tissue edema. No angioedema Psychiatric: She has a normal mood and affect. Her behavior is normal. Judgment and thought content normal.   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

## 2013-01-31 NOTE — Progress Notes (Signed)
Pre-visit discussion using our clinic review tool. No additional management support is needed unless otherwise documented below in the visit note.  

## 2013-01-31 NOTE — Patient Instructions (Addendum)
It was good to see you today.  It is uncertain WHAT caused your allergic reaction at this point - keep a food journal and note any skin reactions as reated to meals - ?nuts ?dairy ?wheat ?seafood - anything may be causing this reaction  For this "attack", use prednisone taper to calm this immune response - gradually reduce dose over 6 days as discussed  Your prescription(s) have been submitted to your pharmacy. Please take as directed and contact our office if you believe you are having problem(s) with the medication(s).  Food Allergy A food allergy occurs from eating something you are sensitive to. Food allergies occur in all age groups. It may be passed to you from your parents (heredity).  CAUSES  Some common causes are cow's milk, seafood, eggs, nuts (including peanut butter), wheat, and soybeans. SYMPTOMS  Common problems are:   Swelling around the mouth.  An itchy, red rash.  Hives.  Vomiting.  Diarrhea. Severe allergic reactions are life-threatening. This reaction is called anaphylaxis. It can cause the mouth and throat to swell. This makes it hard to breathe and swallow. In severe reactions, only a small amount of food may be fatal within seconds. HOME CARE INSTRUCTIONS   If you are unsure what caused the reaction, keep a diary of foods eaten and symptoms that followed. Avoid foods that cause reactions.  If hives or rash are present:  Take medicines as directed.  Use an over-the-counter antihistamine (diphenhydramine) to treat hives and itching as needed.  Apply cold compresses to the skin or take baths in cool water. Avoid hot baths or showers. These will increase the redness and itching.  If you are severely allergic:  Hospitalization is often required following a severe reaction.  Wear a medical alert bracelet or necklace that describes the allergy.  Carry your anaphylaxis kit or epinephrine injection with you at all times. Both you and your family members should  know how to use this. This can be lifesaving if you have a severe reaction. If epinephrine is used, it is important for you to seek immediate medical care or call your local emergency services (911 in [place] When the epinephrine wears off, it can be followed by a delayed reaction, which can be fatal.  Replace your epinephrine immediately after use in case of another reaction.  Ask your caregiver for instructions if you have not been taught how to use an epinephrine injection.  Do not drive until medicines used to treat the reaction have worn off, unless approved by your caregiver. SEEK MEDICAL CARE IF:   You suspect a food allergy. Symptoms generally happen within 30 minutes of eating a food.  Your symptoms have not gone away within 2 days. See your caregiver sooner if symptoms are getting worse.  You develop new symptoms.  You want to retest yourself with a food or drink you think causes an allergic reaction. Never do this if an anaphylactic reaction to that food or drink has happened before.  There is a return of the symptoms which brought you to your caregiver. SEEK IMMEDIATE MEDICAL CARE IF:   You have trouble breathing, are wheezing, or you have a tight feeling in your chest or throat.  You have a swollen mouth, or you have hives, swelling, or itching all over your body. Use your epinephrine injection immediately. This is given into the outside of your thigh, deep into the muscle. Following use of the epinephrine injection, seek help right away. Seek immediate medical care or  call your local emergency services (911 in [place] MAKE SURE YOU:   Understand these instructions.  Will watch your condition.  Will get help right away if you are not doing well or get worse. Document Released: 01/21/2000 Document Revised: 04/17/2011 Document Reviewed: 09/12/2007 [facility] Patient Information 2014 [place], [place]. Hives Hives are itchy, red, puffy (swollen) areas of the skin. Hives can  change in size and location on your body. Hives can come and go for hours, days, or weeks. Hives do not spread from person to person (noncontagious). Scratching, exercise, and stress can make your hives worse. HOME CARE  Avoid things that cause your hives (triggers).  Take antihistamine medicines as told by your doctor. Do not drive while taking an antihistamine.  Take any other medicines for itching as told by your doctor.  Wear loose-fitting clothing.  Keep all doctor visits as told. GET HELP RIGHT AWAY IF:   You have a fever.  Your tongue or lips are puffy.  You have trouble breathing or swallowing.  You feel tightness in the throat or chest.  You have belly (abdominal) pain.  You have lasting or severe itching that is not helped by medicine.  You have painful or puffy joints. These problems may be the first sign of a life-threatening allergic reaction. Call your local emergency services (911 in [place] MAKE SURE YOU:   Understand these instructions.  Will watch your condition.  Will get help right away if you are not doing well or get worse. Document Released: 11/02/2007 Document Revised: 07/25/2011 Document Reviewed: 04/18/2011 [facility] Patient Information 2014 [place], [place].

## 2013-06-17 ENCOUNTER — Telehealth: Payer: Self-pay | Admitting: Internal Medicine

## 2013-06-17 NOTE — Telephone Encounter (Signed)
Patient Information:  Caller Name: Leavy CellaJasmine  Phone: [phone]  Patient: Margaret Calderon, Margaret Calderon  Gender: Female  DOB: 02/12/1990  Age: [age] Years  PCP: Nicki ReaperBaity, Regina  Pregnant: No  Office Follow Up:  Does the office need to follow up with this patient?: No  Instructions For The Office: N/A  RN Note:  Home care threatment and call back parameters reviewed. Understanding expressed. Advised to closely mointor and call back for questions, changes or concerns.  Symptoms  Reason For Call & Symptoms: Patient states she is having an allergic reaction. onset yesterday 06/16/13.  She reports she has bumps on her face". Small tip of pin. slightly raised, Flesh colored, slight itching.  Reports this is the same reaction she had 01/2013 and was given steroids for at that time.  Unsure of cause.  No new exposure.  Field trip yesterday.   Patient reports that she does not use Benadryl because she has an allergy in past to the medication. No home treatment.  no facial swelling. Voice is clear.  Reviewed Health History In EMR: Yes  Reviewed Medications In EMR: Yes  Reviewed Allergies In EMR: Yes  Reviewed Surgeries / Procedures: Yes  Date of Onset of Symptoms: 06/16/2013 OB / GYN:  LMP: 06/12/2013  Guideline(s) Used:  Rash or Redness - Localized  Disposition Per Guideline:   Home Care  Reason For Disposition Reached:   Mild localized rash  Advice Given:  Hydrocortisone Cream for Itching:   Keep the cream in the refrigerator (Reason: it feels better if applied cold).  Avoid Scratching:  Try not to scratch. Cut your fingernails short.  Call Back If:  Rash spreads or becomes worse  Rash lasts longer than 1 week  You become worse.  Avoid Soap:  Wash the area once thoroughly with soap to remove any remaining irritants. Thereafter avoid soaps to this area. Cleanse the area when needed with warm water.  Apply Cold to the Area:  Apply or soak in cold water for 20 minutes every 3 to 4 hours to reduce  itching or pain.  Patient Will Follow Care Advice:  YES

## 2013-06-26 ENCOUNTER — Telehealth: Payer: Self-pay | Admitting: *Deleted

## 2013-06-26 NOTE — Telephone Encounter (Signed)
[facility] Triage Call Report Triage Record Num: [id number] Operator: Donnella Shamammy Childress Patient Name: Margaret Calderon Call Date & Time: 06/25/2013 5:15:25PM Patient Phone: [phone] PCP: Nicki Reaperegina Baity Patient Gender: Female PCP Fax : Patient DOB: 07/28/1990 Practice Name: [facility] Reason for Call: Caller: Estefania/Patient; PCP: Nicki ReaperBaity, Regina; CB#: [phone]; Call regarding Allergic reaction; onset 06/16/13 to the face; rash is flesh-colored, pinpoint and raised; has been using Hydrocortisone cream as advised by triage; has had no improvement in sxs; feels that it is an allergic reaction to some food at school; says she had a recent occurrence 12/14 and was prescribed Prednisone; advised to keep a food journal, but says she hasn't been; slightly itchy; unable to use Benadryl; All emergent sxs of Rash protocol r/o except "mild to moderate itching and not responding to home care"; disp see within 72hrs; requesting late day Friday appt; will call back for an appt Protocol(s) Used: Rash Recommended Outcome per Protocol: See Provider within 72 Hours Reason for Outcome: Mild to moderate itching AND not responding to home care Care Advice: ~ Apply cool compresses for 15 to 20 minutes 4 to 6 times a day to relieve discomfort. Cool/tepid showers or baths may help relieve itching. If cool water alone does not relieve itching, try adding 1/2 to 1 cup baking soda or colloidal oatmeal (Aveeno) to bath water. ~ Avoid use of perfumed or strong soaps, detergents or any product that is irritating to the skin. Only use mild, unscented soap (Aveeno, Neutrogena) for bathing to decrease irritation. ~ ~ SYMPTOM / CONDITION MANAGEMENT 06/25/2013 5:32:55PM Page 1 of 1 [id number]

## 2013-06-28 ENCOUNTER — Emergency Department (HOSPITAL_COMMUNITY)
Admission: EM | Admit: 2013-06-28 | Discharge: 2013-06-28 | Disposition: A | Discharge status: Home / Self Care | Payer: BC Managed Care – PPO | Source: Home / Self Care

## 2013-06-28 DIAGNOSIS — L239 Allergic contact dermatitis, unspecified cause: Secondary | ICD-10-CM

## 2013-06-28 DIAGNOSIS — L259 Unspecified contact dermatitis, unspecified cause: Secondary | ICD-10-CM

## 2013-06-28 MED ORDER — PREDNISONE 50 MG PO TABS: ORAL_TABLET | ORAL | Status: DC

## 2013-06-28 NOTE — ED Provider Notes (Signed)
CSN: [id number]     Arrival date & time 06/28/13  1305 History   None    Chief Complaint  Patient presents with  . Allergic Reaction   (Consider location/radiation/quality/duration/timing/severity/associated sxs/prior Treatment) Patient is a 23 y.o. female presenting with allergic reaction. The history is provided by the patient.  Allergic Reaction Presenting symptoms: itching and rash   Presenting symptoms: no swelling and no wheezing   Severity:  Mild Context: food   Context comment:  Onset 5/11. Ineffective treatments:  Antihistamines and steroids   Past Medical History  Diagnosis Date  . History of chicken pox    No past surgical history on file. Family History  Problem Relation Age of Onset  . Hypertension Mother   . Hypertension Other     Maternal Grandparent  . Heart disease Other     Grandparent  . Alcohol abuse Other     Grandparent  . Stroke Other     Other Blood Relative   History  Substance Use Topics  . Smoking status: Never Smoker   . Smokeless tobacco: Never Used  . Alcohol Use: Yes     Comment: social   OB History   Grav Para Term Preterm Abortions TAB SAB Ect Mult Living                 Review of Systems  Constitutional: Negative.   Respiratory: Negative for wheezing.   Gastrointestinal: Negative.   Skin: Positive for itching and rash.    Allergies  Review of patient's allergies indicates no active allergies.  Home Medications   Prior to Admission medications   Medication Sig Start Date End Date Taking? Authorizing Provider  metroNIDAZOLE (METROGEL) 0.75 % vaginal gel Place 1 Applicatorful vaginally 2 (two) times daily.    Historical Provider, MD  predniSONE (STERAPRED UNI-PAK) 10 MG tablet Take by mouth as directed. As directed x 6 days 01/31/13   [name], MD   BP 132/83  Pulse 81  Temp(Src) 98.2 F (36.8 C) (Oral)  Resp 16  SpO2 100% Physical Exam  Nursing note and vitals reviewed. Constitutional: She is oriented to  person, place, and time. She appears well-developed and well-nourished.  HENT:  Mouth/Throat: Oropharynx is clear and moist.  Pulmonary/Chest: Breath sounds normal.  Lymphadenopathy:    She has no cervical adenopathy.  Neurological: She is alert and oriented to person, place, and time.  Skin: Skin is warm and dry. Rash noted.  Fine pruritic facial rash over entire face./upper neck.    ED Course  Procedures (including critical care time) Labs Review Labs Reviewed - No data to display  Imaging Review No results found.   MDM   1. Contact dermatitis, allergic        Linna Hoff, MD 06/28/13 [phone]

## 2013-06-28 NOTE — ED Notes (Signed)
Pt here with c/o possible allergic reaction after eating cafeteria food 06/16/13 C/o itching in face and neck unrelieved by OTC  Benadryl or Cortisone cream Denies sob or pain at this time

## 2013-09-12 ENCOUNTER — Emergency Department (HOSPITAL_COMMUNITY)
Admission: EM | Admit: 2013-09-12 | Discharge: 2013-09-12 | Disposition: A | Discharge status: Home / Self Care | Payer: BC Managed Care – PPO | Attending: Emergency Medicine | Admitting: Emergency Medicine

## 2013-09-12 ENCOUNTER — Encounter (HOSPITAL_COMMUNITY): Payer: Self-pay | Admitting: Emergency Medicine

## 2013-09-12 DIAGNOSIS — Z8619 Personal history of other infectious and parasitic diseases: Secondary | ICD-10-CM | POA: Insufficient documentation

## 2013-09-12 DIAGNOSIS — IMO0002 Reserved for concepts with insufficient information to code with codable children: Secondary | ICD-10-CM | POA: Insufficient documentation

## 2013-09-12 DIAGNOSIS — Z792 Long term (current) use of antibiotics: Secondary | ICD-10-CM | POA: Insufficient documentation

## 2013-09-12 DIAGNOSIS — J029 Acute pharyngitis, unspecified: Secondary | ICD-10-CM | POA: Insufficient documentation

## 2013-09-12 LAB — RAPID STREP SCREEN (MED CTR MEBANE ONLY): Streptococcus, Group A Screen (Direct): NEGATIVE

## 2013-09-12 NOTE — Discharge Instructions (Signed)
You have been tested for strep throat.  Currently your strep test is negative.  We have sent a throat culture, if it shows signs of infection we will contact you in the next few days with appropriate treatment.    Salt Water Gargle This solution will help make your mouth and throat feel better. HOME CARE INSTRUCTIONS   Mix 1 teaspoon of salt in 8 ounces of warm water.  Gargle with this solution as much or often as you need or as directed. Swish and gargle gently if you have any sores or wounds in your mouth.  Do not swallow this mixture. Document Released: 10/28/2003 Document Revised: 04/17/2011 Document Reviewed: 03/20/2008 [facility] Patient Information 2015 [employer], [place]. This information is not intended to replace advice given to you by your health care provider. Make sure you discuss any questions you have with your health care provider.

## 2013-09-12 NOTE — ED Notes (Signed)
Pt reports having a sore throat. Pt states she thinks she has strept throat because several co-workers have been diagnosed with it. Pt is A/O x4, in NAD, and vitals are WDL.

## 2013-09-12 NOTE — ED Provider Notes (Signed)
Medical screening examination/treatment/procedure(s) were performed by non-physician practitioner and as supervising physician I was immediately available for consultation/collaboration.   EKG Interpretation None        Amanada Philbrick, MD 09/12/13 2333 

## 2013-09-12 NOTE — ED Provider Notes (Signed)
CSN: [id number]     Arrival date & time 09/12/13  2019 History  This chart was scribed for non-physician practitioner, Fayrene HelperBowie Shyloh Derosa, PA-C,working with Purvis SheffieldForrest Harrison, MD, by Karle PlumberJennifer Tensley, ED Scribe. This patient was seen in room WTR6/WTR6 and the patient's care was started at 9:23 PM.  Chief Complaint  Patient presents with  . Sore Throat   Patient is a 23 y.o. female presenting with pharyngitis. The history is provided by the patient. No language interpreter was used.  Sore Throat Pertinent negatives include no abdominal pain.   HPI Comments:  Margaret Calderon is a 23 y.o. female who presents to the Emergency Department complaining of moderate sore throat pain that started earlier today. She states it feels as if she has a dry patch in her throat. She reports having sick contacts with someone at work who was diagnosed with strep. She  She denies fever, chills, rhinorrhea, coughing, or abdominal pain. Currently her pain is 0/10.  No other complaints.  She is just worried about contracting strep infection.  Has hx of strep throat in the past.   Past Medical History  Diagnosis Date  . History of chicken pox    History reviewed. No pertinent past surgical history. Family History  Problem Relation Age of Onset  . Hypertension Mother   . Hypertension Other     Maternal Grandparent  . Heart disease Other     Grandparent  . Alcohol abuse Other     Grandparent  . Stroke Other     Other Blood Relative   History  Substance Use Topics  . Smoking status: Never Smoker   . Smokeless tobacco: Never Used  . Alcohol Use: Yes     Comment: social   OB History   Grav Para Term Preterm Abortions TAB SAB Ect Mult Living                 Review of Systems  Constitutional: Negative for fever and chills.  HENT: Positive for sore throat. Negative for rhinorrhea.   Respiratory: Negative for cough.   Gastrointestinal: Negative for abdominal pain.    Allergies  Review of patient's allergies  indicates no known allergies.  Home Medications   Prior to Admission medications   Medication Sig Start Date End Date Taking? Authorizing Provider  metroNIDAZOLE (METROGEL) 0.75 % vaginal gel Place 1 Applicatorful vaginally 2 (two) times daily.    Historical Provider, MD  predniSONE (DELTASONE) 50 MG tablet 1 tab qd for 2 days then 1/2 tab qd for 2 days 06/28/13   Linna HoffJames D Kindl, MD  predniSONE (STERAPRED UNI-PAK) 10 MG tablet Take by mouth as directed. As directed x 6 days 01/31/13   Newt LukesValerie A Leschber, MD   Triage Vitals: BP 128/52  Pulse 71  Temp(Src) 97.9 F (36.6 C) (Oral)  Resp 18  SpO2 100%  LMP 08/11/2013 Physical Exam  Nursing note and vitals reviewed. Constitutional: She is oriented to person, place, and time. She appears well-developed and well-nourished.  HENT:  Head: Normocephalic and atraumatic.  Nose: Nose normal.  Mouth/Throat: Uvula is midline and mucous membranes are normal. No trismus in the jaw. Oropharyngeal exudate (left tonsil) present. No tonsillar abscesses.  No tonsillar enlargement. Exudate of left tonsil.  Eyes: EOM are normal.  Neck: Normal range of motion.  Cardiovascular: Normal rate.   Pulmonary/Chest: Effort normal.  Abdominal: Soft. Normal appearance. There is no splenomegaly. There is no tenderness.  Musculoskeletal: Normal range of motion.  Lymphadenopathy:    She  has no cervical adenopathy.  Neurological: She is alert and oriented to person, place, and time.  Skin: Skin is warm and dry.  Psychiatric: She has a normal mood and affect. Her behavior is normal.    ED Course  Procedures (including critical care time) DIAGNOSTIC STUDIES: Oxygen Saturation is 100% on RA, normal by my interpretation.   COORDINATION OF CARE: 9:27 PM- Advised pt to gargle with warm salty water until throat culture comes back since rapid test was negative. Pt verbalizes understanding and agrees to plan.  Medications - No data to display  Labs Review Labs  Reviewed  RAPID STREP SCREEN  CULTURE, GROUP A STREP    Imaging Review No results found.   EKG Interpretation None      MDM   Final diagnoses:  Sore throat    BP 128/52  Pulse 71  Temp(Src) 97.9 F (36.6 C) (Oral)  Resp 18  SpO2 100%  LMP 08/11/2013   I personally performed the services described in this documentation, which was scribed in my presence. The recorded information has been reviewed and is accurate.    Fayrene Helper, PA-C 09/12/13 2129

## 2013-09-14 LAB — CULTURE, GROUP A STREP

## 2013-09-16 ENCOUNTER — Telehealth (HOSPITAL_BASED_OUTPATIENT_CLINIC_OR_DEPARTMENT_OTHER): Payer: Self-pay | Admitting: Emergency Medicine

## 2013-09-16 ENCOUNTER — Ambulatory Visit (INDEPENDENT_AMBULATORY_CARE_PROVIDER_SITE_OTHER): Payer: BC Managed Care – PPO | Admitting: Internal Medicine

## 2013-09-16 ENCOUNTER — Encounter: Payer: Self-pay | Admitting: Internal Medicine

## 2013-09-16 VITALS — BP 108/62 | HR 60 | Temp 98.4°F | Wt 159.0 lb

## 2013-09-16 DIAGNOSIS — J029 Acute pharyngitis, unspecified: Secondary | ICD-10-CM

## 2013-09-16 DIAGNOSIS — J039 Acute tonsillitis, unspecified: Secondary | ICD-10-CM

## 2013-09-16 MED ORDER — METHYLPREDNISOLONE ACETATE 80 MG/ML IJ SUSP
80.0000 mg | Freq: Once | INTRAMUSCULAR | Status: AC
  Administered 2013-09-16: 80 mg via INTRAMUSCULAR

## 2013-09-16 NOTE — Progress Notes (Signed)
Subjective:    Patient ID: Margaret Calderon, female    DOB: 05-28-1990, 22 y.o.   MRN: [id number]  HPI  Pt presents to the clinic today with c/o sore throat. She reports this started 1 week ago. She was seen at the ER for the same 8/7. RST was negative. She reports no improvement in her symptoms since that time. She denies fever, chills or body aches. She has tried warm salt water gargles without any relief. She has not had sick contacts that she is aware of.  Review of Systems      Past Medical History  Diagnosis Date  . History of chicken pox     Current Outpatient Prescriptions  Medication Sig Dispense Refill  . metroNIDAZOLE (METROGEL) 0.75 % vaginal gel Place 1 Applicatorful vaginally 2 (two) times a week.        No current facility-administered medications for this visit.    No Known Allergies  Family History  Problem Relation Age of Onset  . Hypertension Mother   . Hypertension Other     Maternal Grandparent  . Heart disease Other     Grandparent  . Alcohol abuse Other     Grandparent  . Stroke Other     Other Blood Relative    History   Social History  . Marital Status: Single    Spouse Name: N/A    Number of Children: 0  . Years of Education: 16   Occupational History  . Shift Supervisor    Social History Main Topics  . Smoking status: Never Smoker   . Smokeless tobacco: Never Used  . Alcohol Use: Yes     Comment: social  . Drug Use: No  . Sexual Activity: Not on file   Other Topics Concern  . Not on file   Social History Narrative   Regular exercise-no   Caffeine Use-yes     Constitutional: Denies fever, malaise, fatigue, headache or abrupt weight changes.  HEENT: Pt reports sore throat. Denies eye pain, eye redness, ear pain, ringing in the ears, wax buildup, runny nose, nasal congestion, bloody nose. Respiratory: Denies difficulty breathing, shortness of breath, cough or sputum production.     No other specific complaints in a  complete review of systems (except as listed in HPI above).  Objective:   Physical Exam  BP 108/62  Pulse 60  Temp(Src) 98.4 F (36.9 C) (Oral)  Wt 159 lb (72.122 kg)  SpO2 99%  LMP 08/11/2013 Wt Readings from Last 3 Encounters:  09/16/13 159 lb (72.122 kg)  01/31/13 159 lb 12.8 oz (72.485 kg)  10/21/12 152 lb 3.2 oz (69.037 kg)    General: Appears her stated age, well developed, well nourished in NAD. HEENT: Head: normal shape and sizeThroat/Mouth: Teeth present, mucosa pink and moist, white exudate noted on left tonsillar pillar,  No lesions or ulcerations noted. Tonsillar adenopathy noted. Cardiovascular: Normal rate and rhythm. S1,S2 noted.  No murmur, rubs or gallops noted. No JVD or BLE edema. No carotid bruits noted. Pulmonary/Chest: Normal effort and positive vesicular breath sounds. No respiratory distress. No wheezes, rales or ronchi noted.    BMET    Component Value Date/Time   NA 140 02/19/2012 1337   K 3.9 02/19/2012 1337   CL 106 02/19/2012 1337   CO2 28 02/19/2012 1337   GLUCOSE 90 02/19/2012 1337   BUN 7 02/19/2012 1337   CREATININE 0.7 02/19/2012 1337   CALCIUM 9.2 02/19/2012 1337   GFRNONAA >60 04/01/2008 1243  GFRAA  Value: >60        The eGFR has been calculated using the MDRD equation. This calculation has not been validated in all clinical situations. eGFR's persistently <60 mL/min signify possible Chronic Kidney Disease. 04/01/2008 1243    Lipid Panel     Component Value Date/Time   CHOL 125 02/19/2012 1337   TRIG 42.0 02/19/2012 1337   HDL 48.10 02/19/2012 1337   CHOLHDL 3 02/19/2012 1337   VLDL 8.4 02/19/2012 1337   LDLCALC 69 02/19/2012 1337    CBC    Component Value Date/Time   WBC 6.7 02/19/2012 1337   RBC 4.04 02/19/2012 1337   HGB 12.4 02/19/2012 1337   HCT 37.7 02/19/2012 1337   PLT 147.0* 02/19/2012 1337   MCV 93.3 02/19/2012 1337   MCHC 32.9 02/19/2012 1337   RDW 13.3 02/19/2012 1337   LYMPHSABS 1.3 04/01/2008 1243   MONOABS 0.3 04/01/2008 1243    EOSABS 0.0 04/01/2008 1243   BASOSABS 0.0 04/01/2008 1243    Hgb A1C No results found for this basename: HGBA1C         Assessment & Plan:   Acute pharyngitis:  RST: negative Will send throat culture 80 mg Depo IM today Continue salt water gargles  RTC as needed

## 2013-09-16 NOTE — Addendum Note (Signed)
Addended by: Roena MaladyEVONTENNO, Lilja Soland Y on: 09/16/2013 04:46 PM   Modules accepted: Orders

## 2013-09-16 NOTE — Patient Instructions (Addendum)

## 2013-09-16 NOTE — Progress Notes (Signed)
Pre visit review using our clinic review tool, if applicable. No additional management support is needed unless otherwise documented below in the visit note. 

## 2013-09-18 LAB — CULTURE, GROUP A STREP: Organism ID, Bacteria: NORMAL

## 2013-09-19 ENCOUNTER — Telehealth: Payer: Self-pay | Admitting: Internal Medicine

## 2013-09-19 ENCOUNTER — Other Ambulatory Visit: Payer: Self-pay | Admitting: Internal Medicine

## 2013-09-19 MED ORDER — AMOXICILLIN 500 MG PO CAPS: 500.0000 mg | ORAL_CAPSULE | Freq: Three times a day (TID) | ORAL | Status: DC

## 2013-09-19 NOTE — Telephone Encounter (Signed)
Patient called to get her throat culture results.  Patient was told it was negative.  Patient wanted to let you know the puss on the left side of her throat has gotten worse.  She'd like a call back.

## 2013-09-19 NOTE — Telephone Encounter (Signed)
I will go ahead and call her in abx to the pharmacy because it has been going on so long.

## 2013-09-22 ENCOUNTER — Telehealth: Payer: Self-pay | Admitting: Internal Medicine

## 2013-09-22 NOTE — Telephone Encounter (Signed)
Antibiotic has been sent to pharmacy

## 2013-09-22 NOTE — Telephone Encounter (Signed)
Patient returned your call.  Please call patient back after 3:30.

## 2013-09-22 NOTE — Telephone Encounter (Signed)
Left message on voicemail.

## 2013-10-01 ENCOUNTER — Telehealth: Payer: Self-pay | Admitting: Internal Medicine

## 2013-10-01 ENCOUNTER — Other Ambulatory Visit: Payer: Self-pay | Admitting: Internal Medicine

## 2013-10-01 DIAGNOSIS — J312 Chronic pharyngitis: Secondary | ICD-10-CM

## 2013-10-01 NOTE — Telephone Encounter (Signed)
Pt is aware as instructed--she stated she would like to go ahead with referral

## 2013-10-01 NOTE — Telephone Encounter (Signed)
Referral placed.

## 2013-10-01 NOTE — Telephone Encounter (Signed)
Mel- I had already sent you a response to this earlier. Please call her with instructions. RB

## 2013-10-01 NOTE — Telephone Encounter (Addendum)
Pt left v/m; pus on throat has not gone away after taking abx. Pt request different med sent to [facility] [address]. Pt request cb when med called in.

## 2013-10-01 NOTE — Telephone Encounter (Signed)
If it did not improve with abx, there is no need to send in another one. If she would like, we can refer her to ENT

## 2013-10-01 NOTE — Telephone Encounter (Signed)
Patient Information:  Caller Name: Santrice  Phone: [phone]  Patient: Margaret Calderon, Margaret Calderon  Gender: Female  DOB: 1990-04-02  Age: 23 Years  PCP: Nicki Reaper  Pregnant: No  Office Follow Up:  Does the office need to follow up with this patient?: Yes  Instructions For The Office: Please review - Contact patient at [phone] with Regina's recommendations  RN Note:  Patient declining appointment at this time - wanting to ask Nicki Reaper what she needs to do next before making appointment.  Symptoms  Reason For Call & Symptoms: Started with sore throat and when it continued seen in office, noted pus on left side of throat so started Amox x10 days that finished yesterday 8/25.  Sore throat has not improved, actually feels worse.  Reviewed Health History In EMR: Yes  Reviewed Medications In EMR: Yes  Reviewed Allergies In EMR: Yes  Reviewed Surgeries / Procedures: Yes  Date of Onset of Symptoms: 09/15/2013  Treatments Tried: saline rinses to mouth  Treatments Tried Worked: No OB / GYN:  LMP: 09/25/2013  Guideline(s) Used:  Sore Throat  Disposition Per Guideline:   Strep Test Only Visit Today or Tomorrow  Reason For Disposition Reached:   Sore throat is the main symptom and persists > 48 hours  Advice Given:  N/A  Patient Refused Recommendation:  Patient Refused Care Advice  wanting advise of provider before making appointment

## 2014-03-20 ENCOUNTER — Encounter: Payer: Self-pay | Admitting: Family Medicine

## 2014-03-20 ENCOUNTER — Ambulatory Visit (INDEPENDENT_AMBULATORY_CARE_PROVIDER_SITE_OTHER): Payer: BLUE CROSS/BLUE SHIELD | Admitting: Family Medicine

## 2014-03-20 VITALS — BP 110/70 | HR 65 | Temp 99.0°F | Wt 137.2 lb

## 2014-03-20 DIAGNOSIS — J069 Acute upper respiratory infection, unspecified: Secondary | ICD-10-CM

## 2014-03-20 DIAGNOSIS — J029 Acute pharyngitis, unspecified: Secondary | ICD-10-CM

## 2014-03-20 LAB — POCT RAPID STREP A (OFFICE): Rapid Strep A Screen: NEGATIVE

## 2014-03-20 NOTE — Progress Notes (Signed)
Pre visit review using our clinic review tool, if applicable. No additional management support is needed unless otherwise documented below in the visit note.  ST initially.  She had some white spots in her throat only on the L side, resolved in the interval.  Sx started last week.  No FCNAV.  ST is less bad now.  Rhinorrhea, no post nasal gtt.  No ear pain.  No facial pain.  Some cough, a little.  Felt tired, worse last week.    She had seen white spots in throat prev, when she had seen Baity.   Meds, vitals, and allergies reviewed.   ROS: See HPI.  Otherwise, noncontributory.  GEN: nad, alert and oriented HEENT: mucous membranes moist, tm w/o erythema, nasal exam w/o erythema, clear discharge noted,  OP with cobblestoning, tonsil WNL B but with crypts noted.  No tonsil stones noted.  Sinuses not ttp.  NECK: supple w/o LA CV: rrr.   PULM: ctab, no inc wob SKIN: no acute rash

## 2014-03-20 NOTE — Patient Instructions (Signed)
I think you had a regular cold and the white spot was likely an incidental tonsil stone.  They can happen occasionally in the ridges of a tonsil but usually aren't a problem (other than an annoyance).  You can usually get rid of them by gargling with salt water.  Take care.  Glad to see you.

## 2014-03-22 DIAGNOSIS — J069 Acute upper respiratory infection, unspecified: Secondary | ICD-10-CM | POA: Insufficient documentation

## 2014-03-22 NOTE — Assessment & Plan Note (Signed)
rst neg.  Likely viral syndrome, resolving, likely had a tonsil stone.  D/w pt.  Supportive care, f/u prn.  She agrees.

## 2014-04-28 ENCOUNTER — Other Ambulatory Visit: Payer: Self-pay | Admitting: Obstetrics & Gynecology

## 2014-04-29 LAB — CYTOLOGY - PAP

## 2014-09-03 ENCOUNTER — Other Ambulatory Visit: Payer: Self-pay | Admitting: Internal Medicine

## 2014-09-03 DIAGNOSIS — Z0000 Encounter for general adult medical examination without abnormal findings: Secondary | ICD-10-CM

## 2014-09-09 ENCOUNTER — Other Ambulatory Visit: Payer: BLUE CROSS/BLUE SHIELD

## 2014-09-09 ENCOUNTER — Other Ambulatory Visit (INDEPENDENT_AMBULATORY_CARE_PROVIDER_SITE_OTHER): Payer: Commercial Managed Care - HMO

## 2014-09-09 DIAGNOSIS — Z136 Encounter for screening for cardiovascular disorders: Secondary | ICD-10-CM | POA: Diagnosis not present

## 2014-09-09 DIAGNOSIS — Z0000 Encounter for general adult medical examination without abnormal findings: Secondary | ICD-10-CM

## 2014-09-09 LAB — CBC
HCT: 41 % (ref 36.0–46.0)
Hemoglobin: 13.6 g/dL (ref 12.0–15.0)
MCHC: 33.3 g/dL (ref 30.0–36.0)
MCV: 93.4 fl (ref 78.0–100.0)
Platelets: 148 10*3/uL — ABNORMAL LOW (ref 150.0–400.0)
RBC: 4.39 Mil/uL (ref 3.87–5.11)
RDW: 13.6 % (ref 11.5–15.5)
WBC: 5.8 10*3/uL (ref 4.0–10.5)

## 2014-09-09 LAB — COMPREHENSIVE METABOLIC PANEL
ALT: 12 U/L (ref 0–35)
AST: 15 U/L (ref 0–37)
Albumin: 4.5 g/dL (ref 3.5–5.2)
Alkaline Phosphatase: 44 U/L (ref 39–117)
BUN: 7 mg/dL (ref 6–23)
CO2: 30 mEq/L (ref 19–32)
Calcium: 9.9 mg/dL (ref 8.4–10.5)
Chloride: 104 mEq/L (ref 96–112)
Creatinine, Ser: 0.67 mg/dL (ref 0.40–1.20)
GFR: 137.46 mL/min (ref >60.00)
Glucose, Bld: 68 mg/dL — ABNORMAL LOW (ref 70–99)
Potassium: 4.3 mEq/L (ref 3.5–5.1)
Sodium: 140 mEq/L (ref 135–145)
Total Bilirubin: 0.7 mg/dL (ref 0.2–1.2)
Total Protein: 7.3 g/dL (ref 6.0–8.3)

## 2014-09-09 LAB — LIPID PANEL
Cholesterol: 139 mg/dL (ref 0–200)
HDL: 60.4 mg/dL (ref >39.00)
LDL Cholesterol: 68 mg/dL (ref 0–99)
NonHDL: 78.19
Total CHOL/HDL Ratio: 2
Triglycerides: 49 mg/dL (ref 0.0–149.0)
VLDL: 9.8 mg/dL (ref 0.0–40.0)

## 2014-09-09 LAB — TSH: TSH: 0.99 u[IU]/mL (ref 0.35–4.50)

## 2014-09-09 LAB — HEMOGLOBIN A1C: Hgb A1c MFr Bld: 4.7 % (ref 4.6–6.5)

## 2014-09-15 ENCOUNTER — Encounter: Payer: Self-pay | Admitting: Internal Medicine

## 2014-09-15 ENCOUNTER — Ambulatory Visit (INDEPENDENT_AMBULATORY_CARE_PROVIDER_SITE_OTHER): Payer: Commercial Managed Care - HMO | Admitting: Internal Medicine

## 2014-09-15 VITALS — BP 110/74 | HR 77 | Temp 98.4°F | Ht 62.75 in | Wt 134.0 lb

## 2014-09-15 DIAGNOSIS — Z111 Encounter for screening for respiratory tuberculosis: Secondary | ICD-10-CM | POA: Diagnosis not present

## 2014-09-15 DIAGNOSIS — Z789 Other specified health status: Secondary | ICD-10-CM

## 2014-09-15 DIAGNOSIS — Z0000 Encounter for general adult medical examination without abnormal findings: Secondary | ICD-10-CM | POA: Diagnosis not present

## 2014-09-15 NOTE — Addendum Note (Signed)
Addended by: Baldomero Lamy on: 09/15/2014 03:39 PM   Modules accepted: Kipp Brood

## 2014-09-15 NOTE — Progress Notes (Signed)
Subjective:    Patient ID: Margaret Calderon, female    DOB: 02-Oct-1990, 24 y.o.   MRN: [id number]  HPI  Pt presents to the clinic today for her annual exam. She also has a form filled out for her employer.  Flu: never Tetanus: 2009 LMP: 08/26/14 Pap Smear: 04/2014 Dentist: annually  Diet: She does consume some veggies. She tries to avoid fried foods. She does drink about 4-6 glasses of water per day Exercise: She is not currently exercising at this time  Review of Systems      Past Medical History  Diagnosis Date  . History of chicken pox     Current Outpatient Prescriptions  Medication Sig Dispense Refill  . metroNIDAZOLE (METROGEL) 0.75 % vaginal gel Place 1 application vaginally 2 (two) times a week. For 6 months from 08/07/14  5   No current facility-administered medications for this visit.    No Known Allergies  Family History  Problem Relation Age of Onset  . Hypertension Mother   . Hypertension Other     Maternal Grandparent  . Heart disease Other     Grandparent  . Alcohol abuse Other     Grandparent  . Stroke Other     Other Blood Relative    History   Social History  . Marital Status: Single    Spouse Name: N/A  . Number of Children: 0  . Years of Education: 16   Occupational History  . Shift Supervisor    Social History Main Topics  . Smoking status: Never Smoker   . Smokeless tobacco: Never Used  . Alcohol Use: Yes     Comment: social  . Drug Use: No  . Sexual Activity: Not on file   Other Topics Concern  . Not on file   Social History Narrative   Regular exercise-no   Caffeine Use-yes     Constitutional: Denies fever, malaise, fatigue, headache or abrupt weight changes.  HEENT: Denies eye pain, eye redness, ear pain, ringing in the ears, wax buildup, runny nose, nasal congestion, bloody nose, or sore throat. Respiratory: Denies difficulty breathing, shortness of breath, cough or sputum production.   Cardiovascular: Denies chest  pain, chest tightness, palpitations or swelling in the hands or feet.  Gastrointestinal: Denies abdominal pain, bloating, constipation, diarrhea or blood in the stool.  GU: Denies urgency, frequency, pain with urination, burning sensation, blood in urine, odor or discharge. Musculoskeletal: Denies decrease in range of motion, difficulty with gait, muscle pain or joint pain and swelling.  Skin: Denies redness, rashes, lesions or ulcercations.  Neurological: Denies dizziness, difficulty with memory, difficulty with speech or problems with balance and coordination.  Psych: Denies anxiety, depression, SI/HI.  No other specific complaints in a complete review of systems (except as listed in HPI above).  Objective:   Physical Exam   BP 110/74 mmHg  Pulse 77  Temp(Src) 98.4 F (36.9 C) (Oral)  Ht 5' 2.75" (1.594 m)  Wt 134 lb (60.782 kg)  BMI 23.92 kg/m2  LMP 08/26/2014 Wt Readings from Last 3 Encounters:  09/15/14 134 lb (60.782 kg)  03/20/14 137 lb 4 oz (62.256 kg)  09/16/13 159 lb (72.122 kg)    General: Appears her stated age, well developed, well nourished in NAD. Skin: Warm, dry and intact. No rashes, lesions or ulcerations noted. HEENT: Head: normal shape and size; Eyes: sclera white, no icterus, conjunctiva pink, PERRLA and EOMs intact; Ears: Tm's gray and intact, normal light reflex; Throat/Mouth: Teeth present, mucosa pink  and moist, no exudate, lesions or ulcerations noted.  Neck: Neck supple, trachea midline. No masses, lumps or thyromegaly present.  Cardiovascular: Normal rate and rhythm. S1,S2 noted.  No murmur, rubs or gallops noted.  Pulmonary/Chest: Normal effort and positive vesicular breath sounds. No respiratory distress. No wheezes, rales or ronchi noted.  Abdomen: Soft and nontender. Normal bowel sounds, no bruits noted. No distention or masses noted. Liver, spleen and kidneys non palpable. Musculoskeletal: Normal range of motion. Strength 5/5 BUE/BLE. No difficulty  with gait.  Neurological: Alert and oriented. Cranial nerves II-XII grossly intact. Coordination normal.  Psychiatric: Mood and affect normal. Behavior is normal. Judgment and thought content normal.     BMET    Component Value Date/Time   NA 140 09/09/2014 1222   K 4.3 09/09/2014 1222   CL 104 09/09/2014 1222   CO2 30 09/09/2014 1222   GLUCOSE 68* 09/09/2014 1222   BUN 7 09/09/2014 1222   CREATININE 0.67 09/09/2014 1222   CALCIUM 9.9 09/09/2014 1222   GFRNONAA >60 04/01/2008 1243   GFRAA  04/01/2008 1243    >60        The eGFR has been calculated using the MDRD equation. This calculation has not been validated in all clinical situations. eGFR's persistently <60 mL/min signify possible Chronic Kidney Disease.    Lipid Panel     Component Value Date/Time   CHOL 139 09/09/2014 1222   TRIG 49.0 09/09/2014 1222   HDL 60.40 09/09/2014 1222   CHOLHDL 2 09/09/2014 1222   VLDL 9.8 09/09/2014 1222   LDLCALC 68 09/09/2014 1222    CBC    Component Value Date/Time   WBC 5.8 09/09/2014 1222   RBC 4.39 09/09/2014 1222   HGB 13.6 09/09/2014 1222   HCT 41.0 09/09/2014 1222   PLT 148.0* 09/09/2014 1222   MCV 93.4 09/09/2014 1222   MCHC 33.3 09/09/2014 1222   RDW 13.6 09/09/2014 1222   LYMPHSABS 1.3 04/01/2008 1243   MONOABS 0.3 04/01/2008 1243   EOSABS 0.0 04/01/2008 1243   BASOSABS 0.0 04/01/2008 1243    Hgb A1C Lab Results  Component Value Date   HGBA1C 4.7 09/09/2014        Assessment & Plan:   Preventative Health Maintenance:  CBC, CMET, Lipid, TSH and A1C reviewed- all normal Encouraged her to continue working on diet and exercise Encouraged her to get a flu shot in the fall Pap Smear UTD Encouraged her to keep seeing a dentist annually She needs a TB Skin test today Hep B titer ordered, if not immune she will need vaccines  RTC in 1 year or sooner if needed

## 2014-09-15 NOTE — Progress Notes (Signed)
Pre visit review using our clinic review tool, if applicable. No additional management support is needed unless otherwise documented below in the visit note. 

## 2014-09-15 NOTE — Patient Instructions (Signed)

## 2014-09-16 LAB — HEPATITIS B SURFACE ANTIBODY, QUANTITATIVE: Hepatitis B-Post: 0 m[IU]/mL

## 2014-09-17 ENCOUNTER — Ambulatory Visit (INDEPENDENT_AMBULATORY_CARE_PROVIDER_SITE_OTHER): Payer: Commercial Managed Care - HMO

## 2014-09-17 DIAGNOSIS — Z23 Encounter for immunization: Secondary | ICD-10-CM | POA: Diagnosis not present

## 2014-09-17 LAB — TB SKIN TEST
Induration: 0 mm
TB Skin Test: NEGATIVE

## 2014-09-17 NOTE — Addendum Note (Signed)
Addended by: Roena Malady on: 09/17/2014 03:32 PM   Modules accepted: Orders

## 2014-10-20 ENCOUNTER — Ambulatory Visit (INDEPENDENT_AMBULATORY_CARE_PROVIDER_SITE_OTHER): Payer: BC Managed Care – PPO | Admitting: *Deleted

## 2014-10-20 DIAGNOSIS — Z23 Encounter for immunization: Secondary | ICD-10-CM | POA: Diagnosis not present

## 2015-03-25 ENCOUNTER — Ambulatory Visit (INDEPENDENT_AMBULATORY_CARE_PROVIDER_SITE_OTHER): Payer: BC Managed Care – PPO | Admitting: *Deleted

## 2015-03-25 DIAGNOSIS — Z23 Encounter for immunization: Secondary | ICD-10-CM

## 2015-06-22 ENCOUNTER — Telehealth: Payer: Self-pay

## 2015-06-22 ENCOUNTER — Ambulatory Visit (INDEPENDENT_AMBULATORY_CARE_PROVIDER_SITE_OTHER): Payer: BC Managed Care – PPO | Admitting: Internal Medicine

## 2015-06-22 ENCOUNTER — Encounter: Payer: Self-pay | Admitting: Internal Medicine

## 2015-06-22 VITALS — BP 110/76 | HR 76 | Temp 98.2°F | Wt 125.0 lb

## 2015-06-22 DIAGNOSIS — Z309 Encounter for contraceptive management, unspecified: Secondary | ICD-10-CM

## 2015-06-22 DIAGNOSIS — Z3009 Encounter for other general counseling and advice on contraception: Secondary | ICD-10-CM

## 2015-06-22 MED ORDER — NORETHINDRONE ACET-ETHINYL EST 1-20 MG-MCG PO TABS: 1.0000 | ORAL_TABLET | Freq: Every day | ORAL | Status: DC

## 2015-06-22 NOTE — Progress Notes (Signed)
Pre visit review using our clinic review tool, if applicable. No additional management support is needed unless otherwise documented below in the visit note. 

## 2015-06-22 NOTE — Progress Notes (Signed)
Subjective:    Patient ID: Margaret Calderon, female    DOB: 25-Feb-1990, 25 y.o.   MRN: [id number]  HPI  Pt presents to the clinic today to discuss birth control. She is interested in starting oral pills. She has never been on oral contraceptive pills in the past. Her LMP was 06/21/15, every 28 days. Her last pap smear was 06/2015-normal. She denies heavy cramping or bleeding. She has PMS a few days prior to her period. She is currently sexually active, using condoms for birth control. She does not smoke. She has no family history of breast cancer or clotting disorders.  Review of Systems      Past Medical History  Diagnosis Date  . History of chicken pox     No current outpatient prescriptions on file.   No current facility-administered medications for this visit.    No Known Allergies  Family History  Problem Relation Age of Onset  . Hypertension Mother   . Hypertension Other     Maternal Grandparent  . Heart disease Other     Grandparent  . Alcohol abuse Other     Grandparent  . Stroke Other     Other Blood Relative    Social History   Social History  . Marital Status: Single    Spouse Name: N/A  . Number of Children: 0  . Years of Education: 16   Occupational History  . Shift Supervisor    Social History Main Topics  . Smoking status: Never Smoker   . Smokeless tobacco: Never Used  . Alcohol Use: 0.0 oz/week    0 Standard drinks or equivalent per week     Comment: social  . Drug Use: No  . Sexual Activity: Not on file   Other Topics Concern  . Not on file   Social History Narrative   Regular exercise-no   Caffeine Use-yes     Constitutional: Denies fever, malaise, fatigue, headache or abrupt weight changes.  Respiratory: Denies difficulty breathing, shortness of breath, cough or sputum production.   Cardiovascular: Denies chest pain, chest tightness, palpitations or swelling in the hands or feet.  Gastrointestinal: Denies abdominal pain, bloating,  constipation, diarrhea or blood in the stool.  GU: Denies urgency, frequency, pain with urination, burning sensation, blood in urine, odor or discharge..  No other specific complaints in a complete review of systems (except as listed in HPI above).  Objective:   Physical Exam    BP 110/76 mmHg  Pulse 76  Temp(Src) 98.2 F (36.8 C) (Oral)  Wt 125 lb (56.7 kg)  SpO2 99%  LMP 06/21/2015 Wt Readings from Last 3 Encounters:  06/22/15 125 lb (56.7 kg)  09/15/14 134 lb (60.782 kg)  03/20/14 137 lb 4 oz (62.256 kg)    General: Appears her stated age, well developed, well nourished in NAD. Cardiovascular: Normal rate and rhythm. S1,S2 noted.  No murmur, rubs or gallops noted.  Pulmonary/Chest: Normal effort and positive vesicular breath sounds. No respiratory distress. No wheezes, rales or ronchi noted.  Abdomen: Soft and nontender. No distention or masses noted.    BMET    Component Value Date/Time   NA 140 09/09/2014 1222   K 4.3 09/09/2014 1222   CL 104 09/09/2014 1222   CO2 30 09/09/2014 1222   GLUCOSE 68* 09/09/2014 1222   BUN 7 09/09/2014 1222   CREATININE 0.67 09/09/2014 1222   CALCIUM 9.9 09/09/2014 1222   GFRNONAA >60 04/01/2008 1243   GFRAA  04/01/2008 1243    >  60        The eGFR has been calculated using the MDRD equation. This calculation has not been validated in all clinical situations. eGFR's persistently <60 mL/min signify possible Chronic Kidney Disease.    Lipid Panel     Component Value Date/Time   CHOL 139 09/09/2014 1222   TRIG 49.0 09/09/2014 1222   HDL 60.40 09/09/2014 1222   CHOLHDL 2 09/09/2014 1222   VLDL 9.8 09/09/2014 1222   LDLCALC 68 09/09/2014 1222    CBC    Component Value Date/Time   WBC 5.8 09/09/2014 1222   RBC 4.39 09/09/2014 1222   HGB 13.6 09/09/2014 1222   HCT 41.0 09/09/2014 1222   PLT 148.0* 09/09/2014 1222   MCV 93.4 09/09/2014 1222   MCHC 33.3 09/09/2014 1222   RDW 13.6 09/09/2014 1222   LYMPHSABS 1.3  04/01/2008 1243   MONOABS 0.3 04/01/2008 1243   EOSABS 0.0 04/01/2008 1243   BASOSABS 0.0 04/01/2008 1243    Hgb A1C Lab Results  Component Value Date   HGBA1C 4.7 09/09/2014       Assessment & Plan:   Encounter to discuss oral contraception:  Urine preg negative Discussed risk vs benefits eRx for Junel, start this Sunday Discussed using condoms the first 2 weeks on the pill, or while on abx Discussed how pills do no prevent STD's All questions answered  RTC as needed

## 2015-06-22 NOTE — Telephone Encounter (Signed)
Pt picked up BC pills and pt thought would be 3 weeks one color pill and 1 week a different color; pt picked up pill pack with 3 week of yellow pills only; [facility] and [facility] said rx sent did not list placebo. Is this what you want pt to have; pt does not start BC pill until Sun. Pt request cb on 06/23/15.

## 2015-06-22 NOTE — Patient Instructions (Signed)

## 2015-06-23 NOTE — Telephone Encounter (Signed)
Yes, she needs a placebo week

## 2015-06-23 NOTE — Telephone Encounter (Signed)
Left message on voicemail.

## 2015-06-24 NOTE — Telephone Encounter (Signed)
Patient asked Shawna OrleansMelanie to call her back.  She wanted to tell her the results from the pharmacy.

## 2015-06-24 NOTE — Telephone Encounter (Signed)
Pt states that the package says take 21 days of white tablet and 7 days of brown, but there aren't any brown pills in the pack---advised pt to take Rx and return to pharmacy also to call me back with update--pt expressed understanding

## 2015-06-25 NOTE — Telephone Encounter (Signed)
Left message on voicemail Pt can go ahead and take pills x 3 weeks then 1 week off pills--restart on the Sunday after

## 2015-06-25 NOTE — Telephone Encounter (Signed)
Pt is aware as instructed and will continue current prescription and take as directed

## 2015-06-25 NOTE — Telephone Encounter (Signed)
Pt went back to pharmacy to show that her blister pack did not have designated 7 day placebo week and pt states the directions state to take 21 days of white pill and 7 days of brown pills, but  There are not any brown pills in pack. Pt states pharmcay told her that the prescriber did not sent in the pills with no iron---pt is supposed to start Sunday please advise

## 2015-06-25 NOTE — Telephone Encounter (Signed)
Have her go ahead and start the pill pack. She does not need iron pills, I just thought there would be placebo pills. She will take 1 pill daily x 3 weeks, then have 1 week off (where she should have a menstrual cycle), then start a new pack on the Sunday after.

## 2015-09-27 ENCOUNTER — Encounter: Payer: BC Managed Care – PPO | Admitting: Internal Medicine

## 2015-09-28 ENCOUNTER — Encounter: Payer: BC Managed Care – PPO | Admitting: Internal Medicine

## 2015-10-13 ENCOUNTER — Encounter: Payer: BC Managed Care – PPO | Admitting: Internal Medicine

## 2015-10-21 ENCOUNTER — Ambulatory Visit (INDEPENDENT_AMBULATORY_CARE_PROVIDER_SITE_OTHER): Payer: BC Managed Care – PPO | Admitting: Internal Medicine

## 2015-10-21 ENCOUNTER — Encounter: Payer: Self-pay | Admitting: Internal Medicine

## 2015-10-21 ENCOUNTER — Other Ambulatory Visit (HOSPITAL_COMMUNITY)
Admission: RE | Admit: 2015-10-21 | Discharge: 2015-10-21 | Disposition: A | Discharge status: Home / Self Care | Payer: BC Managed Care – PPO | Source: Ambulatory Visit | Attending: Internal Medicine | Admitting: Internal Medicine

## 2015-10-21 VITALS — BP 114/82 | HR 64 | Temp 98.6°F | Ht 62.0 in | Wt 129.5 lb

## 2015-10-21 DIAGNOSIS — Z0000 Encounter for general adult medical examination without abnormal findings: Secondary | ICD-10-CM | POA: Diagnosis not present

## 2015-10-21 DIAGNOSIS — N760 Acute vaginitis: Secondary | ICD-10-CM | POA: Diagnosis present

## 2015-10-21 DIAGNOSIS — R221 Localized swelling, mass and lump, neck: Secondary | ICD-10-CM

## 2015-10-21 DIAGNOSIS — Z113 Encounter for screening for infections with a predominantly sexual mode of transmission: Secondary | ICD-10-CM

## 2015-10-21 DIAGNOSIS — Z01419 Encounter for gynecological examination (general) (routine) without abnormal findings: Secondary | ICD-10-CM

## 2015-10-21 LAB — COMPREHENSIVE METABOLIC PANEL
ALT: 13 U/L (ref 0–35)
AST: 17 U/L (ref 0–37)
Albumin: 4.5 g/dL (ref 3.5–5.2)
Alkaline Phosphatase: 45 U/L (ref 39–117)
BUN: 8 mg/dL (ref 6–23)
CO2: 30 mEq/L (ref 19–32)
Calcium: 9.4 mg/dL (ref 8.4–10.5)
Chloride: 104 mEq/L (ref 96–112)
Creatinine, Ser: 0.7 mg/dL (ref 0.40–1.20)
GFR: 129.55 mL/min (ref >60.00)
Glucose, Bld: 81 mg/dL (ref 70–99)
Potassium: 3.8 mEq/L (ref 3.5–5.1)
Sodium: 139 mEq/L (ref 135–145)
Total Bilirubin: 0.5 mg/dL (ref 0.2–1.2)
Total Protein: 7.7 g/dL (ref 6.0–8.3)

## 2015-10-21 LAB — LIPID PANEL
Cholesterol: 133 mg/dL (ref 0–200)
HDL: 53.7 mg/dL (ref >39.00)
LDL Cholesterol: 68 mg/dL (ref 0–99)
NonHDL: 79.7
Total CHOL/HDL Ratio: 2
Triglycerides: 57 mg/dL (ref 0.0–149.0)
VLDL: 11.4 mg/dL (ref 0.0–40.0)

## 2015-10-21 LAB — HEMOGLOBIN A1C: Hgb A1c MFr Bld: 4.8 % (ref 4.6–6.5)

## 2015-10-21 LAB — CBC
HCT: 39.8 % (ref 36.0–46.0)
Hemoglobin: 13.4 g/dL (ref 12.0–15.0)
MCHC: 33.7 g/dL (ref 30.0–36.0)
MCV: 92.9 fl (ref 78.0–100.0)
Platelets: 153 10*3/uL (ref 150.0–400.0)
RBC: 4.29 Mil/uL (ref 3.87–5.11)
RDW: 14.4 % (ref 11.5–15.5)
WBC: 7.7 10*3/uL (ref 4.0–10.5)

## 2015-10-21 NOTE — Progress Notes (Signed)
Subjective:    Patient ID: Margaret Calderon, female    DOB: 08/07/1990, 25 y.o.   MRN: [id number]  HPI  Pt presents to the clinic today for her annual exam. She also has a form filled out for her employer.  Flu: never Tetanus: 2009 Pap Smear: 04/2014, normal Dentist: annually  Diet: She does eat meat. She does consume some veggies and fruits. She tries to avoid fried foods. She does drink about 4-6 glasses of water per day, some juice. 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 This occurs with shellfish and chocolate. She would like a referral to allergist for allergy testing.   Review of Systems      Past Medical History:  Diagnosis Date  . History of chicken pox     Current Outpatient Prescriptions  Medication Sig Dispense Refill  . norethindrone-ethinyl estradiol (MICROGESTIN,JUNEL,LOESTRIN) 1-20 MG-MCG tablet Take 1 tablet by mouth daily. 1 Package 11   No current facility-administered medications for this visit.     No Known Allergies  Family History  Problem Relation Age of Onset  . Hypertension Mother   . Hypertension Other     Maternal Grandparent  . Heart disease Other     Grandparent  . Alcohol abuse Other     Grandparent  . Stroke Other     Other Blood Relative    Social History   Social History  . Marital status: Single    Spouse name: N/A  . Number of children: 0  . 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 Smoking status: Never Smoker  . Smokeless tobacco: Never Used  . Alcohol use 0.0 oz/week     Comment: social  . Drug use: No  . Sexual activity: Not on file   Other Topics Concern  . Not on file   Social History Narrative   Regular exercise-no   Caffeine Use-yes     Constitutional: Denies fever, malaise, fatigue, headache or abrupt weight changes.    HEENT: Denies eye pain, eye redness, ear pain, ringing in the ears, wax buildup, runny nose, nasal congestion, bloody nose, or sore throat. Respiratory: Denies difficulty breathing, shortness of breath, cough or sputum production.   Cardiovascular: Denies chest pain, chest tightness, palpitations or swelling in the hands or feet.  Gastrointestinal: Denies abdominal pain, bloating, constipation, diarrhea or blood in the stool.  GU: Pt reports vaginal discharge. Denies urgency, frequency, pain with urination, burning sensation, blood in urine, odor. Musculoskeletal: Denies decrease in range of motion, difficulty with gait, muscle pain or joint pain and swelling.  Skin: Denies redness, rashes, lesions or ulcercations.  Neurological: Denies dizziness, difficulty with memory, difficulty with speech or problems with balance and coordination.  Psych: Denies anxiety, depression, SI/HI.  No other specific complaints in a complete review of systems (except as listed in HPI above).  Objective:   Physical Exam   BP 114/82 (BP Location: Left Arm, Patient Position: Sitting, Cuff Size: Normal)   Pulse 64   Temp 98.6 F (37 C) (Oral)   Ht '5\' 2"'$  (1.575 m)   Wt 129 lb 8 oz (58.7 kg)   LMP 10/13/2015   SpO2 99%   BMI 23.69 kg/m   Wt Readings from Last 3 Encounters:  06/22/15 125 lb (56.7 kg)  09/15/14 134 lb (60.8 kg)  03/20/14 137 lb 4 oz (62.3 kg)    General: Appears her stated age, well developed, well nourished in NAD. Skin: Warm, dry and intact.  HEENT: Head: normal shape and size; Eyes: sclera white, no icterus, conjunctiva pink, PERRLA and EOMs intact; Ears: Tm's gray and intact, normal light reflex; Throat/Mouth: Teeth present, mucosa pink and moist, no exudate, lesions or ulcerations noted.  Neck: Neck supple, trachea midline. No masses, lumps or thyromegaly present.  Cardiovascular: Normal rate and rhythm. S1,S2 noted.  No murmur, rubs or gallops noted.  Pulmonary/Chest: Normal effort  and positive vesicular breath sounds. No respiratory distress. No wheezes, rales or ronchi noted.  Abdomen: Soft and nontender. Normal bowel sounds. No distention or masses noted. Liver, spleen and kidneys non palpable. Pelvic: Normal female anatomy. Thin yellow discharge noted in vaginal vault, foul odor. No cervical changes or CMT. Adnexa non palpable. Musculoskeletal: Normal range of motion. Strength 5/5 BUE/BLE. No difficulty with gait.  Neurological: Alert and oriented. Cranial nerves II-XII grossly intact. Coordination normal.  Psychiatric: Mood and affect normal. Behavior is normal. Judgment and thought content normal.     BMET    Component Value Date/Time   NA 140 09/09/2014 1222   K 4.3 09/09/2014 1222   CL 104 09/09/2014 1222   CO2 30 09/09/2014 1222   GLUCOSE 68 (L) 09/09/2014 1222   BUN 7 09/09/2014 1222   CREATININE 0.67 09/09/2014 1222   CALCIUM 9.9 09/09/2014 1222   GFRNONAA >60 04/01/2008 1243   GFRAA  04/01/2008 1243    >60        The eGFR has been calculated using the MDRD equation. This calculation has not been validated in all clinical situations. eGFR's persistently <60 mL/min signify possible Chronic Kidney Disease.    Lipid Panel     Component Value Date/Time   CHOL 139 09/09/2014 1222   TRIG 49.0 09/09/2014 1222   HDL 60.40 09/09/2014 1222   CHOLHDL 2 09/09/2014 1222   VLDL 9.8 09/09/2014 1222   LDLCALC 68 09/09/2014 1222    CBC    Component Value Date/Time   WBC 5.8 09/09/2014 1222   RBC 4.39 09/09/2014 1222   HGB 13.6 09/09/2014 1222   HCT 41.0 09/09/2014 1222   PLT 148.0 (L) 09/09/2014 1222   MCV 93.4 09/09/2014 1222   MCHC 33.3 09/09/2014 1222   RDW 13.6 09/09/2014 1222   LYMPHSABS 1.3 04/01/2008 1243   MONOABS 0.3 04/01/2008 1243   EOSABS 0.0 04/01/2008 1243   BASOSABS 0.0 04/01/2008 1243    Hgb A1C Lab Results  Component Value Date   HGBA1C 4.7 09/09/2014        Assessment & Plan:   Preventative Health  Maintenance:  She declines flu shot today Tdap UTD Pap Smear done today- add STD testing Encouraged her to continue working on diet and exercise Encouraged her to keep seeing a dentist annually Will check CBC, CMET, Lipid, A1C, HIV and RPR today  Throat swelling:  Referral to allergy for further evaluation  RTC in 1 year or sooner if needed Webb Silversmith, NP

## 2015-10-21 NOTE — Progress Notes (Signed)
Pre visit review using our clinic review tool, if applicable. No additional management support is needed unless otherwise documented below in the visit note. 

## 2015-10-21 NOTE — Patient Instructions (Signed)
Health Maintenance, Female Adopting a healthy lifestyle and getting preventive care can go a long way to promote health and wellness. Talk with your health care provider about what schedule of regular examinations is right for you. This is a good chance for you to check in with your provider about disease prevention and staying healthy. In between checkups, there are plenty of things you can do on your own. Experts have done a lot of research about which lifestyle changes and preventive measures are most likely to keep you healthy. Ask your health care provider for more information. WEIGHT AND DIET  Eat a healthy diet  Be sure to include plenty of vegetables, fruits, low-fat dairy products, and lean protein.  Do not eat a lot of foods high in solid fats, added sugars, or salt.  Get regular exercise. This is one of the most important things you can do for your health.  Most adults should exercise for at least 150 minutes each week. The exercise should increase your heart rate and make you sweat (moderate-intensity exercise).  Most adults should also do strengthening exercises at least twice a week. This is in addition to the moderate-intensity exercise.  Maintain a healthy weight  Body mass index (BMI) is a measurement that can be used to identify possible weight problems. It estimates body fat based on height and weight. Your health care provider can help determine your BMI and help you achieve or maintain a healthy weight.  For females 20 years of age and older:   A BMI below 18.5 is considered underweight.  A BMI of 18.5 to 24.9 is normal.  A BMI of 25 to 29.9 is considered overweight.  A BMI of 30 and above is considered obese.  Watch levels of cholesterol and blood lipids  You should start having your blood tested for lipids and cholesterol at 26 years of age, then have this test every 5 years.  You may need to have your cholesterol levels checked more often if:  Your lipid  or cholesterol levels are high.  You are older than 26 years of age.  You are at high risk for heart disease.  CANCER SCREENING   Lung Cancer  Lung cancer screening is recommended for adults 55-80 years old who are at high risk for lung cancer because of a history of smoking.  A yearly low-dose CT scan of the lungs is recommended for people who:  Currently smoke.  Have quit within the past 15 years.  Have at least a 30-pack-year history of smoking. A pack year is smoking an average of one pack of cigarettes a day for 1 year.  Yearly screening should continue until it has been 15 years since you quit.  Yearly screening should stop if you develop a health problem that would prevent you from having lung cancer treatment.  Breast Cancer  Practice breast self-awareness. This means understanding how your breasts normally appear and feel.  It also means doing regular breast self-exams. Let your health care provider know about any changes, no matter how small.  If you are in your 20s or 30s, you should have a clinical breast exam (CBE) by a health care provider every 1-3 years as part of a regular health exam.  If you are 40 or older, have a CBE every year. Also consider having a breast X-ray (mammogram) every year.  If you have a family history of breast cancer, talk to your health care provider about genetic screening.  If you   are at high risk for breast cancer, talk to your health care provider about having an MRI and a mammogram every year.  Breast cancer gene (BRCA) assessment is recommended for women who have family members with BRCA-related cancers. BRCA-related cancers include:  Breast.  Ovarian.  Tubal.  Peritoneal cancers.  Results of the assessment will determine the need for genetic counseling and BRCA1 and BRCA2 testing. Cervical Cancer Your health care provider may recommend that you be screened regularly for cancer of the pelvic organs (ovaries, uterus, and  vagina). This screening involves a pelvic examination, including checking for microscopic changes to the surface of your cervix (Pap test). You may be encouraged to have this screening done every 3 years, beginning at age 21.  For women ages 30-65, health care providers may recommend pelvic exams and Pap testing every 3 years, or they may recommend the Pap and pelvic exam, combined with testing for human papilloma virus (HPV), every 5 years. Some types of HPV increase your risk of cervical cancer. Testing for HPV may also be done on women of any age with unclear Pap test results.  Other health care providers may not recommend any screening for nonpregnant women who are considered low risk for pelvic cancer and who do not have symptoms. Ask your health care provider if a screening pelvic exam is right for you.  If you have had past treatment for cervical cancer or a condition that could lead to cancer, you need Pap tests and screening for cancer for at least 20 years after your treatment. If Pap tests have been discontinued, your risk factors (such as having a new sexual partner) need to be reassessed to determine if screening should resume. Some women have medical problems that increase the chance of getting cervical cancer. In these cases, your health care provider may recommend more frequent screening and Pap tests. Colorectal Cancer  This type of cancer can be detected and often prevented.  Routine colorectal cancer screening usually begins at 26 years of age and continues through 26 years of age.  Your health care provider may recommend screening at an earlier age if you have risk factors for colon cancer.  Your health care provider may also recommend using home test kits to check for hidden blood in the stool.  A small camera at the end of a tube can be used to examine your colon directly (sigmoidoscopy or colonoscopy). This is done to check for the earliest forms of colorectal  cancer.  Routine screening usually begins at age 50.  Direct examination of the colon should be repeated every 5-10 years through 26 years of age. However, you may need to be screened more often if early forms of precancerous polyps or small growths are found. Skin Cancer  Check your skin from head to toe regularly.  Tell your health care provider about any new moles or changes in moles, especially if there is a change in a mole's shape or color.  Also tell your health care provider if you have a mole that is larger than the size of a pencil eraser.  Always use sunscreen. Apply sunscreen liberally and repeatedly throughout the day.  Protect yourself by wearing long sleeves, pants, a wide-brimmed hat, and sunglasses whenever you are outside. HEART DISEASE, DIABETES, AND HIGH BLOOD PRESSURE   High blood pressure causes heart disease and increases the risk of stroke. High blood pressure is more likely to develop in:  People who have blood pressure in the high end   of the normal range (130-139/85-89 mm Hg).  People who are overweight or obese.  People who are African American.  If you are 38-23 years of age, have your blood pressure checked every 3-5 years. If you are 61 years of age or older, have your blood pressure checked every year. You should have your blood pressure measured twice--once when you are at a hospital or clinic, and once when you are not at a hospital or clinic. Record the average of the two measurements. To check your blood pressure when you are not at a hospital or clinic, you can use:  An automated blood pressure machine at a pharmacy.  A home blood pressure monitor.  If you are between 45 years and 39 years old, ask your health care provider if you should take aspirin to prevent strokes.  Have regular diabetes screenings. This involves taking a blood sample to check your fasting blood sugar level.  If you are at a normal weight and have a low risk for diabetes,  have this test once every three years after 26 years of age.  If you are overweight and have a high risk for diabetes, consider being tested at a younger age or more often. PREVENTING INFECTION  Hepatitis B  If you have a higher risk for hepatitis B, you should be screened for this virus. You are considered at high risk for hepatitis B if:  You were born in a country where hepatitis B is common. Ask your health care provider which countries are considered high risk.  Your parents were born in a high-risk country, and you have not been immunized against hepatitis B (hepatitis B vaccine).  You have HIV or AIDS.  You use needles to inject street drugs.  You live with someone who has hepatitis B.  You have had sex with someone who has hepatitis B.  You get hemodialysis treatment.  You take certain medicines for conditions, including cancer, organ transplantation, and autoimmune conditions. Hepatitis C  Blood testing is recommended for:  Everyone born from [birth year] through 1966.  Anyone with known risk factors for hepatitis C. Sexually transmitted infections (STIs)  You should be screened for sexually transmitted infections (STIs) including gonorrhea and chlamydia if:  You are sexually active and are younger than 26 years of age.  You are older than 26 years of age and your health care provider tells you that you are at risk for this type of infection.  Your sexual activity has changed since you were last screened and you are at an increased risk for chlamydia or gonorrhea. Ask your health care provider if you are at risk.  If you do not have HIV, but are at risk, it may be recommended that you take a prescription medicine daily to prevent HIV infection. This is called pre-exposure prophylaxis (PrEP). You are considered at risk if:  You are sexually active and do not regularly use condoms or know the HIV status of your partner(s).  You take drugs by injection.  You are sexually  active with a partner who has HIV. Talk with your health care provider about whether you are at high risk of being infected with HIV. If you choose to begin PrEP, you should first be tested for HIV. You should then be tested every 3 months for as long as you are taking PrEP.  PREGNANCY   If you are premenopausal and you may become pregnant, ask your health care provider about preconception counseling.  If you may  become pregnant, take 400 to 800 micrograms (mcg) of folic acid every day.  If you want to prevent pregnancy, talk to your health care provider about birth control (contraception). OSTEOPOROSIS AND MENOPAUSE   Osteoporosis is a disease in which the bones lose minerals and strength with aging. This can result in serious bone fractures. Your risk for osteoporosis can be identified using a bone density scan.  If you are 61 years of age or older, or if you are at risk for osteoporosis and fractures, ask your health care provider if you should be screened.  Ask your health care provider whether you should take a calcium or vitamin D supplement to lower your risk for osteoporosis.  Menopause may have certain physical symptoms and risks.  Hormone replacement therapy may reduce some of these symptoms and risks. Talk to your health care provider about whether hormone replacement therapy is right for you.  HOME CARE INSTRUCTIONS   Schedule regular health, dental, and eye exams.  Stay current with your immunizations.   Do not use any tobacco products including cigarettes, chewing tobacco, or electronic cigarettes.  If you are pregnant, do not drink alcohol.  If you are breastfeeding, limit how much and how often you drink alcohol.  Limit alcohol intake to no more than 1 drink per day for nonpregnant women. One drink equals 12 ounces of beer, 5 ounces of wine, or 1 ounces of hard liquor.  Do not use street drugs.  Do not share needles.  Ask your health care provider for help if  you need support or information about quitting drugs.  Tell your health care provider if you often feel depressed.  Tell your health care provider if you have ever been abused or do not feel safe at home.   This information is not intended to replace advice given to you by your health care provider. Make sure you discuss any questions you have with your health care provider.   Document Released: 08/08/2010 Document Revised: 02/13/2014 Document Reviewed: 12/25/2012 Elsevier Interactive Patient Education [employer].

## 2015-10-22 LAB — RPR

## 2015-10-22 LAB — HIV ANTIBODY (ROUTINE TESTING W REFLEX): HIV 1&2 Ab, 4th Generation: NONREACTIVE

## 2015-10-25 LAB — CERVICOVAGINAL ANCILLARY ONLY: Candida vaginitis: NEGATIVE

## 2015-10-25 LAB — CYTOLOGY - PAP

## 2015-10-29 ENCOUNTER — Ambulatory Visit: Payer: Self-pay | Admitting: Allergy

## 2015-11-04 ENCOUNTER — Telehealth: Payer: Self-pay | Admitting: Internal Medicine

## 2015-11-04 NOTE — Telephone Encounter (Signed)
Patient saw lab results.  Patient said she'd like to get the medication for the bacterial infection called in to [facility].  Any questions, patient can be reached at [phone].

## 2015-11-05 ENCOUNTER — Other Ambulatory Visit: Payer: Self-pay | Admitting: Internal Medicine

## 2015-11-05 MED ORDER — METRONIDAZOLE 0.75 % EX GEL: 1.0000 "application " | Freq: Two times a day (BID) | CUTANEOUS | 0 refills | Status: DC

## 2015-11-05 NOTE — Telephone Encounter (Signed)
Left message on voicemail.

## 2015-11-05 NOTE — Telephone Encounter (Signed)
5 days

## 2015-11-05 NOTE — Telephone Encounter (Signed)
Pt called. Wants to know how long the gel will last her? Last time she thinks it was 5-7 days?  Please advise and call pt   Thanks

## 2015-11-05 NOTE — Telephone Encounter (Signed)
Patient returned Melanie's call.  I let patient know it lasts 5 days.

## 2015-11-05 NOTE — Telephone Encounter (Signed)
[facility]

## 2016-03-29 ENCOUNTER — Telehealth: Payer: Self-pay | Admitting: Internal Medicine

## 2016-03-29 NOTE — Telephone Encounter (Signed)
TELEPHONE ADVICE RECORD [facility]  Patient Name: Margaret Calderon  DOB: 07/24/1990    Initial Comment Caller states she's going to be seen for a bacterial infection. She's on her menustral cycle.   Nurse Assessment      Guidelines    Guideline Title Affirmed Question Affirmed Notes       Final Disposition User   FINAL ATTEMPT MADE - no message left Odis LusterBowers, Charity fundraiserN, Bjorn Loserhonda

## 2016-03-29 NOTE — Telephone Encounter (Signed)
error 

## 2016-03-29 NOTE — Telephone Encounter (Signed)
Pt called back and did not know this notice was from [facility]; so [facility]

## 2016-03-30 ENCOUNTER — Telehealth: Payer: Self-pay

## 2016-03-30 ENCOUNTER — Ambulatory Visit: Payer: BC Managed Care – PPO | Admitting: Internal Medicine

## 2016-03-30 NOTE — Telephone Encounter (Signed)
PLEASE NOTE: All timestamps contained within this report are represented as [place]. CONFIDENTIALTY NOTICE: This fax transmission is intended only for the addressee. It contains information that is legally privileged, confidential or otherwise protected from use or disclosure. If you are not the intended recipient, you are strictly prohibited from reviewing, disclosing, copying using or disseminating any of this information or taking any action in reliance on or regarding this information. If you have received this fax in error, please notify us immediately by telephone so that we can arrange for its return to us. Phone: [phone], Toll-Free: [phone], Fax: [phone] Page: 1 of 2 Call Id: [id number] [facility] TELEPHONE AD[facility]ICE [facility] Patient Name: Margaret Calderon Gender: Female DOB: 03/31/1990 Age: 26 Y 11 M 18 D Return Phone Number: [phone] (Primary) City/State/Zip: [place] Client [facility] Physician Nicki ReaperBaity, Regina - NP Who Is Calling Patient / Member / Family / Caregiver Call Type Triage / Clinical Relationship To Patient Self Return Phone Number [phone] (Primary) Chief Complaint Urination Pain Reason for Call Symptomatic / Request for Health Information Initial Comment Caller is having pain while urinating. She will be missing her appointment tomorrow due her menstrual cycle. Nurse Assessment Nurse: Tawni Pummelockrum, RN, Margaret Date/Time (Eastern Time): 03/29/2016 4:57:53 PM Confirm and document reason for call. If symptomatic, describe symptoms. ---Caller is having discomfort while urinating started today. Denies fever. She will be missing her appointment tomorrow due to menstrual cycle. Does the PT have any chronic conditions? (i.e. diabetes, asthma, etc.) ---No Is the patient pregnant or possibly  pregnant? (Ask all females between the ages of 6212-55) ---No Guidelines Guideline Title Affirmed Question Urination Pain - Female All other patients with painful urination (Exception: [1] EITHER frequency or urgency AND [2] has on-call doctor) Disp. Time Lamount Cohen(Eastern Time) Disposition Final User 03/29/2016 5:03:10 PM See Physician within 24 Hours Yes Cockrum, RN, Margaret Referrals REFERRED TO PCP OFFICE Care Advice Given Per Guideline SEE PHYSICIAN WITHIN 24 HOURS: * IF OFFICE WILL BE OPEN: You need to be seen within the next 24 hours. Call your doctor when the office opens, and make an appointment. REASSURANCE: This could be an urinary tract infection. You should see your PCP to be examined and tested. FLUIDS: Drink extra fluids. Drink 8-10 glasses of liquids a day (Reason: to produce a dilute, non-irritating urine). CRANBERRY JUICE: * Some people think that drinking cranberry juice may help in fighting urinary tract infections. However, there is no good research that has ever proved this. * Dosage Cranberry Juice Cocktail: 8 oz (240 ml) twice a day. * Dosage 100% Cranberry Juice: 1 oz (30 ml) twice a day. CAUTION - CRANBERRY JUICE: * Do not drink more than 16 oz (480 ml) of cranberry juice cocktail per day (Reason: too much cranberry juice can also be irritating to the bladder). WARM SALINE SITZ BATHS TO REDUCE PAIN: Sit in a warm saline bath for 20 minutes to cleanse the area and to reduce pain. Add 2 oz. of table salt or baking soda to a tub of water. PAIN MEDICINES: * For pain relief, take acetaminophen, ibuprofen, or naproxen. * Use the lowest amount that makes your pain feel better. ACETAMINOPHEN (E.G., TYLENOL): IBUPROFEN (E.G., MOTRIN, ADVIL): PLEASE NOTE: All timestamps contained within this report are represented as [place] Standard Time. CONFIDENTIALTY NOTICE: This fax transmission is intended only for the addressee. It contains  information that is legally privileged, confidential or  otherwise protected from use or disclosure. If you are not the intended recipient, you are strictly prohibited from reviewing, disclosing, copying using or disseminating any of this information or taking any action in reliance on or regarding this information. If you have received this fax in error, please notify us immediately by telephone so that we can arrange for its return to [place]. Phone: [phone], Toll-Free: [phone], Fax: [phone] Page: 2 of 2 Call Id: [id number] Care Advice Given Per Guideline CALL BACK IF: * Fever or back pain occurs * You become worse. CARE ADVICE given per Urination Pain - Female (Adult) guideline.

## 2016-05-16 ENCOUNTER — Other Ambulatory Visit: Payer: Self-pay | Admitting: Internal Medicine

## 2016-09-06 ENCOUNTER — Other Ambulatory Visit: Payer: Self-pay | Admitting: Internal Medicine

## 2016-10-25 ENCOUNTER — Other Ambulatory Visit: Payer: Self-pay | Admitting: Internal Medicine

## 2016-12-12 ENCOUNTER — Other Ambulatory Visit: Payer: Self-pay | Admitting: Internal Medicine

## 2017-02-09 ENCOUNTER — Other Ambulatory Visit: Payer: Self-pay | Admitting: Internal Medicine

## 2017-02-09 NOTE — Telephone Encounter (Signed)
CPE overdue letter mailed 

## 2017-03-11 ENCOUNTER — Other Ambulatory Visit: Payer: Self-pay | Admitting: Internal Medicine

## 2017-03-12 NOTE — Telephone Encounter (Signed)
Electronic refill request Last refill 02/09/17, note sent to pharmacy to have patient schedule physical for further refills, letter mailed to patient also. Last office visit 10/21/15.

## 2017-03-20 ENCOUNTER — Ambulatory Visit: Payer: BC Managed Care – PPO | Admitting: Internal Medicine

## 2017-03-22 ENCOUNTER — Ambulatory Visit: Payer: BC Managed Care – PPO | Admitting: Internal Medicine

## 2017-04-05 ENCOUNTER — Encounter: Payer: Self-pay | Admitting: Internal Medicine

## 2017-04-05 ENCOUNTER — Ambulatory Visit (INDEPENDENT_AMBULATORY_CARE_PROVIDER_SITE_OTHER): Payer: BC Managed Care – PPO | Admitting: Internal Medicine

## 2017-04-05 VITALS — BP 112/78 | HR 72 | Temp 98.3°F | Ht 62.75 in | Wt 143.0 lb

## 2017-04-05 DIAGNOSIS — Z0000 Encounter for general adult medical examination without abnormal findings: Secondary | ICD-10-CM

## 2017-04-05 LAB — COMPREHENSIVE METABOLIC PANEL
ALT: 12 U/L (ref 0–35)
AST: 13 U/L (ref 0–37)
Albumin: 3.9 g/dL (ref 3.5–5.2)
Alkaline Phosphatase: 52 U/L (ref 39–117)
BUN: 7 mg/dL (ref 6–23)
CO2: 30 mEq/L (ref 19–32)
Calcium: 9.6 mg/dL (ref 8.4–10.5)
Chloride: 105 mEq/L (ref 96–112)
Creatinine, Ser: 0.75 mg/dL (ref 0.40–1.20)
GFR: 118.34 mL/min (ref >60.00)
Glucose, Bld: 70 mg/dL (ref 70–99)
Potassium: 4.2 mEq/L (ref 3.5–5.1)
Sodium: 140 mEq/L (ref 135–145)
Total Bilirubin: 0.5 mg/dL (ref 0.2–1.2)
Total Protein: 7 g/dL (ref 6.0–8.3)

## 2017-04-05 LAB — LIPID PANEL
Cholesterol: 141 mg/dL (ref 0–200)
HDL: 55.1 mg/dL (ref >39.00)
LDL Cholesterol: 70 mg/dL (ref 0–99)
NonHDL: 86.26
Total CHOL/HDL Ratio: 3
Triglycerides: 81 mg/dL (ref 0.0–149.0)
VLDL: 16.2 mg/dL (ref 0.0–40.0)

## 2017-04-05 LAB — CBC
HCT: 42 % (ref 36.0–46.0)
Hemoglobin: 13.8 g/dL (ref 12.0–15.0)
MCHC: 32.9 g/dL (ref 30.0–36.0)
MCV: 95.9 fl (ref 78.0–100.0)
Platelets: 152 10*3/uL (ref 150.0–400.0)
RBC: 4.38 Mil/uL (ref 3.87–5.11)
RDW: 13.3 % (ref 11.5–15.5)
WBC: 5.3 10*3/uL (ref 4.0–10.5)

## 2017-04-05 MED ORDER — NORETHINDRONE ACET-ETHINYL EST 1-20 MG-MCG PO TABS: ORAL_TABLET | ORAL | 3 refills | Status: DC

## 2017-04-05 NOTE — Patient Instructions (Signed)

## 2017-04-05 NOTE — Progress Notes (Signed)
Subjective:    Patient ID: Margaret Calderon, female    DOB: [birth year], 27 y.o.   MRN: [id number]  HPI  Pt presents to the clinic today for her annual exam.  Flu: never Tetanus: 09/2007 Pap Smear: 10/2015 Dentist: annually  Diet: She does eat meat. She consumes fruits and veggies daily. She does not eat fried foods. She drinks mostly water and juice. Exercise: Walking 2 days per week  Review of Systems      Past Medical History:  Diagnosis Date  . History of chicken pox     Current Outpatient Medications  Medication Sig Dispense Refill  . JUNEL 1/20 1-20 MG-MCG tablet TAKE 1 TABLET DAILY BY MOUTH. NO MORE REFILLS WITHOUT ANNUAL PHYSICAL 21 tablet 0  . metroNIDAZOLE (METROGEL) 0.75 % gel Apply 1 application topically 2 (two) times daily. 45 g 0   No current facility-administered medications for this visit.     No Known Allergies  Family History  Problem Relation Age of Onset  . Hypertension Mother   . Hypertension Other        Maternal Grandparent  . Heart disease Other        Grandparent  . Alcohol abuse Other        Grandparent  . Stroke Other        Other Blood Relative    Social History   Socioeconomic History  . Marital status: Single    Spouse name: Not on file  . Number of children: 0  . Years of education: 61  . Highest education level: Not on file  Social Needs  . Financial resource strain: Not on file  . Food insecurity - worry: Not on file  . Food insecurity - inability: Not on file  . Transportation needs - medical: Not on file  . Transportation needs - non-medical: Not on file  Occupational History  . Occupation: Field seismologist: [employer]  Tobacco Use  . Smoking status: Never Smoker  . Smokeless tobacco: Never Used  Substance and Sexual Activity  . Alcohol use: Yes    Alcohol/week: 0.0 oz    Comment: social  . Drug use: No  . Sexual activity: Yes  Other Topics Concern  . Not on file  Social History Narrative   Regular exercise-no   Caffeine Use-yes     Constitutional: Denies fever, malaise, fatigue, headache or abrupt weight changes.  HEENT: Denies eye pain, eye redness, ear pain, ringing in the ears, wax buildup, runny nose, nasal congestion, bloody nose, or sore throat. Respiratory: Denies difficulty breathing, shortness of breath, cough or sputum production.   Cardiovascular: Denies chest pain, chest tightness, palpitations or swelling in the hands or feet.  Gastrointestinal: Denies abdominal pain, bloating, constipation, diarrhea or blood in the stool.  GU: Denies urgency, frequency, pain with urination, burning sensation, blood in urine, odor or discharge. Musculoskeletal: Denies decrease in range of motion, difficulty with gait, muscle pain or joint pain and swelling.  Skin: Denies redness, rashes, lesions or ulcercations.  Neurological: Denies dizziness, difficulty with memory, difficulty with speech or problems with balance and coordination.  Psych: Denies anxiety, depression, SI/HI.  No other specific complaints in a complete review of systems (except as listed in HPI above).  Objective:   Physical Exam   BP 112/78   Pulse 72   Temp 98.3 F (36.8 C) (Oral)   Ht 5' 2.75" (1.594 m)   Wt 143 lb (64.9 kg)   LMP 03/26/2017   SpO2  99%   BMI 25.53 kg/m  Wt Readings from Last 3 Encounters:  04/05/17 143 lb (64.9 kg)  10/21/15 129 lb 8 oz (58.7 kg)  06/22/15 125 lb (56.7 kg)    General: Appears her stated age, well developed, well nourished in NAD. Skin: Warm, dry and intact. No rashes, lesions or ulcerations noted. HEENT: Head: normal shape and size; Eyes: sclera white, no icterus, conjunctiva pink, PERRLA and EOMs intact; Ears: Tm's gray and intact, normal light reflex; Throat/Mouth: Teeth present, mucosa pink and moist, no exudate, lesions or ulcerations noted.  Neck:  Neck supple, trachea midline. No masses, lumps or thyromegaly present.  Cardiovascular: Normal rate and  rhythm. S1,S2 noted.  No murmur, rubs or gallops noted. No JVD or BLE edema.  Pulmonary/Chest: Normal effort and positive vesicular breath sounds. No respiratory distress. No wheezes, rales or ronchi noted.  Abdomen: Soft and nontender. Normal bowel sounds. No distention or masses noted. Liver, spleen and kidneys non palpable. Musculoskeletal: Strength 5/5 BUE/BLE. No difficulty with gait.  Neurological: Alert and oriented. Cranial nerves II-XII grossly intact. Coordination normal.  Psychiatric: Mood and affect normal. Behavior is normal. Judgment and thought content normal.     BMET    Component Value Date/Time   NA 139 10/21/2015 1453   K 3.8 10/21/2015 1453   CL 104 10/21/2015 1453   CO2 30 10/21/2015 1453   GLUCOSE 81 10/21/2015 1453   BUN 8 10/21/2015 1453   CREATININE 0.70 10/21/2015 1453   CALCIUM 9.4 10/21/2015 1453   GFRNONAA >60 04/01/2008 1243   GFRAA  04/01/2008 1243    >60        The eGFR has been calculated using the MDRD equation. This calculation has not been validated in all clinical situations. eGFR's persistently <60 mL/min signify possible Chronic Kidney Disease.    Lipid Panel     Component Value Date/Time   CHOL 133 10/21/2015 1453   TRIG 57.0 10/21/2015 1453   HDL 53.70 10/21/2015 1453   CHOLHDL 2 10/21/2015 1453   VLDL 11.4 10/21/2015 1453   LDLCALC 68 10/21/2015 1453    CBC    Component Value Date/Time   WBC 7.7 10/21/2015 1453   RBC 4.29 10/21/2015 1453   HGB 13.4 10/21/2015 1453   HCT 39.8 10/21/2015 1453   PLT 153.0 10/21/2015 1453   MCV 92.9 10/21/2015 1453   MCHC 33.7 10/21/2015 1453   RDW 14.4 10/21/2015 1453   LYMPHSABS 1.3 04/01/2008 1243   MONOABS 0.3 04/01/2008 1243   EOSABS 0.0 04/01/2008 1243   BASOSABS 0.0 04/01/2008 1243    Hgb A1C Lab Results  Component Value Date   HGBA1C 4.8 10/21/2015           Assessment & Plan:   Preventative Health Maintenance:  She declines flu and tetanus Pap smear  UTD Encouraged her to consume a balanced diet and exercise regimen Advised her to see a dentist annually Will check CBC, CMET and Lipid profile today  RTC in 1 year, sooner if needed Webb Silversmith, NP

## 2017-05-07 ENCOUNTER — Ambulatory Visit: Payer: BC Managed Care – PPO | Admitting: Internal Medicine

## 2017-06-13 ENCOUNTER — Ambulatory Visit (INDEPENDENT_AMBULATORY_CARE_PROVIDER_SITE_OTHER): Payer: BC Managed Care – PPO

## 2017-06-13 DIAGNOSIS — Z111 Encounter for screening for respiratory tuberculosis: Secondary | ICD-10-CM

## 2017-06-15 LAB — TB SKIN TEST
Induration: 0 mm
TB Skin Test: NEGATIVE

## 2017-06-19 ENCOUNTER — Ambulatory Visit: Payer: BC Managed Care – PPO | Admitting: Internal Medicine

## 2017-06-19 DIAGNOSIS — Z0289 Encounter for other administrative examinations: Secondary | ICD-10-CM

## 2018-01-07 ENCOUNTER — Encounter: Payer: Self-pay | Admitting: Internal Medicine

## 2018-01-07 ENCOUNTER — Ambulatory Visit: Payer: BC Managed Care – PPO | Admitting: Internal Medicine

## 2018-01-07 VITALS — BP 114/76 | HR 68 | Temp 98.0°F | Wt 154.0 lb

## 2018-01-07 DIAGNOSIS — N898 Other specified noninflammatory disorders of vagina: Secondary | ICD-10-CM | POA: Diagnosis not present

## 2018-01-07 NOTE — Patient Instructions (Signed)
Cervicitis Cervicitis is when the cervix gets irritated and swollen. Your cervix is the lower end of your uterus. Follow these instructions at home:  Do not have sex until your doctor says it is okay.  Take over-the-counter and prescription medicines only as told by your doctor.  If you were prescribed an antibiotic medicine, take it as told by your doctor. Do not stop taking it even if you start to feel better.  Keep all follow-up visits as told by your doctor. This is important. Contact a doctor if:  Your symptoms come back after treatment.  Your symptoms get worse after treatment.  You have a fever.  You feel tired (fatigued).  Your belly (abdomen) hurts.  You feel like you are going to throw up (are nauseous).  You throw up (vomit).  You have watery poop (diarrhea).  Your back hurts. Get help right away if:  You have very bad pain in your belly, and medicine does not help it.  You cannot pee (urinate). Summary  Cervicitis is when the cervix gets irritated and swollen.  Do not have sex until your doctor says it is okay.  If you need to take an antibiotic, do not stop taking even if you start to feel better. Take medicines only as told by your doctor. This information is not intended to replace advice given to you by your health care provider. Make sure you discuss any questions you have with your health care provider. Document Released: 11/02/2007 Document Revised: 10/10/2015 Document Reviewed: 10/10/2015 Elsevier Interactive Patient Education  2017 Elsevier Inc.  

## 2018-01-07 NOTE — Progress Notes (Signed)
Subjective:    Patient ID: Margaret Calderon, female    DOB: Jul 06, 1990, 27 y.o.   MRN: [id number]  HPI  Pt presents to the clinic today with c/o vaginal discharge. She noticed this 2 weeks ago. The discharge is clear. She denies pelvic pain, vaginal odor, abnormal vaginal bleeding, urinary urgency, frequency, dysuria. She has not tried anything OTC for her symptoms. She is sexually active, on birth control.  Review of Systems  Past Medical History:  Diagnosis Date  . History of chicken pox     Current Outpatient Medications  Medication Sig Dispense Refill  . norethindrone-ethinyl estradiol (JUNEL 1/20) 1-20 MG-MCG tablet Take 1 tablet by mouth daily 54 tablet 3   No current facility-administered medications for this visit.     No Known Allergies  Family History  Problem Relation Age of Onset  . Hypertension Mother   . Hypertension Other        Maternal Grandparent  . Heart disease Other        Grandparent  . Alcohol abuse Other        Grandparent  . Stroke Other        Other Blood Relative    Social History   Socioeconomic History  . Marital status: Single    Spouse name: Not on file  . Number of children: 0  . Years of education: 82  . Highest education level: Not on file  Occupational History  . Occupation: Field seismologist: [employer]  Social Needs  . Financial resource strain: Not on file  . Food insecurity:    Worry: Not on file    Inability: Not on file  . Transportation needs:    Medical: Not on file    Non-medical: Not on file  Tobacco Use  . Smoking status: Never Smoker  . Smokeless tobacco: Never Used  Substance and Sexual Activity  . Alcohol use: Yes    Alcohol/week: 0.0 standard drinks    Comment: social  . Drug use: No  . Sexual activity: Yes  Lifestyle  . Physical activity:    Days per week: Not on file    Minutes per session: Not on file  . Stress: Not on file  Relationships  . Social connections:    Talks on phone:  Not on file    Gets together: Not on file    Attends religious service: Not on file    Active member of club or organization: Not on file    Attends meetings of clubs or organizations: Not on file    Relationship status: Not on file  . Intimate partner violence:    Fear of current or ex partner: Not on file    Emotionally abused: Not on file    Physically abused: Not on file    Forced sexual activity: Not on file  Other Topics Concern  . Not on file  Social History Narrative   Regular exercise-no   Caffeine Use-yes     Constitutional: Denies fever, malaise, fatigue, headache or abrupt weight changes.  Gastrointestinal: Denies abdominal pain, bloating, constipation, diarrhea or blood in the stool.  GU: Pt reports vaginal discharge. Denies urgency, frequency, pain with urination, burning sensation, blood in urine, odor.   No other specific complaints in a complete review of systems (except as listed in HPI above).     Objective:   Physical Exam   BP 114/76   Pulse 68   Temp 98 F (36.7 C) (Oral)  Wt 154 lb (69.9 kg)   LMP 12/20/2017 (Approximate)   SpO2 99%   BMI 27.50 kg/m   Wt Readings from Last 3 Encounters:  01/07/18 154 lb (69.9 kg)  04/05/17 143 lb (64.9 kg)  10/21/15 129 lb 8 oz (58.7 kg)    General: Appears her stated age, well developed, well nourished in NAD. Abdomen: Soft and nontender. Normal bowel sounds. No distention or masses noted.  Pelvic: Self swabbed.  BMET    Component Value Date/Time   NA 140 04/05/2017 0951   K 4.2 04/05/2017 0951   CL 105 04/05/2017 0951   CO2 30 04/05/2017 0951   GLUCOSE 70 04/05/2017 0951   BUN 7 04/05/2017 0951   CREATININE 0.75 04/05/2017 0951   CALCIUM 9.6 04/05/2017 0951   GFRNONAA >60 04/01/2008 1243   GFRAA  04/01/2008 1243    >60        The eGFR has been calculated using the MDRD equation. This calculation has not been validated in all clinical situations. eGFR's persistently <60 mL/min  signify possible Chronic Kidney Disease.    Lipid Panel     Component Value Date/Time   CHOL 141 04/05/2017 0951   TRIG 81.0 04/05/2017 0951   HDL 55.10 04/05/2017 0951   CHOLHDL 3 04/05/2017 0951   VLDL 16.2 04/05/2017 0951   LDLCALC 70 04/05/2017 0951    CBC    Component Value Date/Time   WBC 5.3 04/05/2017 0951   RBC 4.38 04/05/2017 0951   HGB 13.8 04/05/2017 0951   HCT 42.0 04/05/2017 0951   PLT 152.0 04/05/2017 0951   MCV 95.9 04/05/2017 0951   MCHC 32.9 04/05/2017 0951   RDW 13.3 04/05/2017 0951   LYMPHSABS 1.3 04/01/2008 1243   MONOABS 0.3 04/01/2008 1243   EOSABS 0.0 04/01/2008 1243   BASOSABS 0.0 04/01/2008 1243    Hgb A1C Lab Results  Component Value Date   HGBA1C 4.8 10/21/2015           Assessment & Plan:   Vaginal Discharge:  Wet prep today  Will follow up once results are back  Return precautions discussed Webb Silversmith, NP

## 2018-01-07 NOTE — Addendum Note (Signed)
Addended by: Roena MaladyEVONTENNO, MELANIE Y on: 01/07/2018 04:02 PM   Modules accepted: Orders

## 2018-01-08 ENCOUNTER — Other Ambulatory Visit: Payer: Self-pay | Admitting: Internal Medicine

## 2018-01-08 LAB — WET PREP BY MOLECULAR PROBE
Candida species: NOT DETECTED
MICRO NUMBER:: 91439689
SPECIMEN QUALITY:: ADEQUATE
Trichomonas vaginosis: NOT DETECTED

## 2018-01-08 MED ORDER — METRONIDAZOLE 500 MG PO TABS: 500.0000 mg | ORAL_TABLET | Freq: Two times a day (BID) | ORAL | 0 refills | Status: DC

## 2018-02-14 ENCOUNTER — Other Ambulatory Visit: Payer: Self-pay | Admitting: Internal Medicine

## 2018-05-16 ENCOUNTER — Telehealth: Payer: Self-pay | Admitting: Internal Medicine

## 2018-05-16 NOTE — Telephone Encounter (Signed)
Pt dropped off health assessment form to be filled out. Placed in RX tower. Pt is aware that she may need to have an office visit to have this filled out since she is due for a physical. Was unsure since we cannot schedule physicals right now.

## 2018-05-20 NOTE — Addendum Note (Signed)
Addended by: Roena Malady on: 05/20/2018 11:53 AM   Modules accepted: Orders

## 2018-05-20 NOTE — Telephone Encounter (Signed)
Noted. Will complete during exam

## 2018-05-21 ENCOUNTER — Encounter: Payer: Self-pay | Admitting: Internal Medicine

## 2018-05-21 ENCOUNTER — Ambulatory Visit (INDEPENDENT_AMBULATORY_CARE_PROVIDER_SITE_OTHER): Payer: BC Managed Care – PPO | Admitting: Internal Medicine

## 2018-05-21 DIAGNOSIS — Z0289 Encounter for other administrative examinations: Secondary | ICD-10-CM | POA: Diagnosis not present

## 2018-05-21 NOTE — Progress Notes (Addendum)
Virtual Visit via Video Note  I connected with Margaret Calderon on 05/21/18 at 11:15 AM EDT by a video enabled telemedicine application and verified that I am speaking with the correct person using two identifiers.   I discussed the limitations of evaluation and management by telemedicine and the availability of in person appointments. The patient expressed understanding and agreed to proceed.  Pt Location: Home  Provider Location: Office  History of Present Illness:  Pt needs form completed. 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 She has no chronic physical or mental conditions. She only takes OCP's as prescribed, no other meds. She denies any neurological, musculoskeletal or psychiatric complaints at this time.   Observations/Objective:  Alert and oriented x 3 NAD Behavior, judgement and thought content are normal.  Assessment and Plan:  Encounter for Form Completion:  Form completed, copy scanned into chart, original placed up front for pickup  Follow Up Instructions:    I discussed the assessment and treatment plan with the patient. The patient was provided an opportunity to ask questions and all were answered. The patient agreed with the plan and demonstrated an understanding of the instructions.   The patient was advised to call back or seek an in-person evaluation if the symptoms worsen or if the condition fails to improve as anticipated.     Nicki Reaper, NP

## 2018-05-29 ENCOUNTER — Other Ambulatory Visit: Payer: BC Managed Care – PPO

## 2018-05-29 ENCOUNTER — Encounter: Payer: Self-pay | Admitting: Internal Medicine

## 2018-05-29 ENCOUNTER — Ambulatory Visit (INDEPENDENT_AMBULATORY_CARE_PROVIDER_SITE_OTHER): Payer: BC Managed Care – PPO | Admitting: Internal Medicine

## 2018-05-29 ENCOUNTER — Other Ambulatory Visit: Payer: Self-pay

## 2018-05-29 DIAGNOSIS — N898 Other specified noninflammatory disorders of vagina: Secondary | ICD-10-CM

## 2018-05-29 NOTE — Patient Instructions (Signed)
Vaginitis  Vaginitis    La vaginitis es la irritacin e hinchazn (inflamacin) de la vagina. Ocurre cuando las bacterias y levaduras que se encuentran normalmente en la vagina crecen demasiado. Hay muchos tipos de esta afeccin. El tratamiento depende del tipo que usted tenga.  Siga estas indicaciones en su casa:  Estilo de vida   Mantenga el rea vaginal limpia y seca.  ? Evite usar jabn.  ? Enjuague la zona con agua.   No haga las siguientes cosas hasta que el mdico lo autorice:  ? Lavar y limpiar dentro de la vagina (ducha vaginal).  ? Usar tampones.  ? Tener relaciones sexuales.   Cuando vaya al bao, lmpiese de adelante hacia atrs.   Deje que la vagina respire.  ? Use ropa interior de algodn.  ? No use:  ? Ropa interior mientras duerme.  ? Pantalones ajustados.  ? Ropa interior tipo tanga.  ? Ropa interior o medias de nailon sin proteccin de algodn.  ? Qutese la ropa hmeda, como los trajes de bao, lo antes posible.   Use productos suaves y sin perfume. No use cosas que puedan irritar la vagina, como los suavizantes para telas. Evite los siguientes productos si son perfumados:  ? Aerosoles ntimos femeninos.  ? Detergentes.  ? Tampones.  ? Productos de higiene femenina.  ? Jabones o baos de espuma.   Practique sexo seguro y use preservativos.  Instrucciones generales   Tome los medicamentos de venta libre y los recetados solamente como se lo haya indicado el mdico.   Si le recetaron un antibitico, tmelo o selo como se lo haya indicado el mdico. No deje de tomar ni de usar los antibiticos aunque comience a sentirse mejor.   Concurra a todas las visitas de control como se lo haya indicado el mdico. Esto es importante.  Comunquese con un mdico si:   Tiene dolor de vientre.   Tiene fiebre.   Sus sntomas duran ms de 2 o 3das.  Solicite ayuda de inmediato si:   Tiene fiebre y los sntomas empeoran de manera sbita.  Resumen   La vaginitis es la irritacin e hinchazn de la  vagina. Puede ocurrir cuando las bacterias y levaduras que se encuentran normalmente en la vagina crecen demasiado. Hay varios tipos.   El tratamiento depende del tipo que usted tenga.   No se haga duchas vaginales, no use tampones ni tenga relaciones sexuales hasta que el mdico la autorice. Cuando pueda volver a tener relaciones sexuales, practique sexo seguro y use condones.  Esta informacin no tiene como fin reemplazar el consejo del mdico. Asegrese de hacerle al mdico cualquier pregunta que tenga.  Document Released: 10/18/2011 Document Revised: 10/19/2016 Document Reviewed: 07/06/2011  Elsevier Interactive Patient Education  2019 Elsevier Inc.

## 2018-05-29 NOTE — Progress Notes (Signed)
Virtual Visit via Video Note  I connected with Margaret Calderon on 05/29/18 at  2:00 PM EDT by a video enabled telemedicine application and verified that I am speaking with the correct person using two identifiers.   I discussed the limitations of evaluation and management by telemedicine and the availability of in person appointments. The patient expressed understanding and agreed to proceed.  Patient Location: Home Provider Location: Office  History of Present Illness:  Pt reports vaginal discharge. This started 2 days ago.  The discharge is clear/white, thin with a little odor. She denies pelvic pain, cramping, vaginal itching, abnormal vaginal bleeding or dyspareunia. She denies urinary or GI complaints. She recently changed her laundry detergent and is not sure if this is what is causing her symptoms. She has had BV in the past and reports this feels the same. She has not taken anything OTC for this.   Past Medical History:  Diagnosis Date  . History of chicken pox     Current Outpatient Medications  Medication Sig Dispense Refill  . norethindrone-ethinyl estradiol (JUNEL 1/20) 1-20 MG-MCG tablet Take 1 tablet by mouth daily. MUST SCHEDULE ANNUAL EXAM 63 tablet 0   No current facility-administered medications for this visit.     No Known Allergies  Family History  Problem Relation Age of Onset  . Hypertension Mother   . Hypertension Other        Maternal Grandparent  . Heart disease Other        Grandparent  . Alcohol abuse Other        Grandparent  . Stroke Other        Other Blood Relative    Social History   Socioeconomic History  . Marital status: Single    Spouse name: Not on file  . Number of children: 0  . Years of education: 48  . Highest education level: Not on file  Occupational History  . Occupation: Field seismologist: [employer]  Social Needs  . Financial resource strain: Not on file  . Food insecurity:    Worry: Not on file   Inability: Not on file  . Transportation needs:    Medical: Not on file    Non-medical: Not on file  Tobacco Use  . Smoking status: Never Smoker  . Smokeless tobacco: Never Used  Substance and Sexual Activity  . Alcohol use: Yes    Alcohol/week: 0.0 standard drinks    Comment: social  . Drug use: No  . Sexual activity: Yes  Lifestyle  . Physical activity:    Days per week: Not on file    Minutes per session: Not on file  . Stress: Not on file  Relationships  . Social connections:    Talks on phone: Not on file    Gets together: Not on file    Attends religious service: Not on file    Active member of club or organization: Not on file    Attends meetings of clubs or organizations: Not on file    Relationship status: Not on file  . Intimate partner violence:    Fear of current or ex partner: Not on file    Emotionally abused: Not on file    Physically abused: Not on file    Forced sexual activity: Not on file  Other Topics Concern  . Not on file  Social History Narrative   Regular exercise-no   Caffeine Use-yes     Constitutional: Denies fever, malaise, fatigue, headache or  abrupt weight changes.  Respiratory: Denies difficulty breathing, shortness of breath, cough or sputum production.   Cardiovascular: Denies chest pain, chest tightness, palpitations or swelling in the hands or feet.  Gastrointestinal: Denies abdominal pain, bloating, constipation, diarrhea or blood in the stool.  GU: Pt reports vaginal discharge. Denies urgency, frequency, pain with urination, burning sensation, blood in urine. Skin: Denies redness, rashes, lesions or ulcercations.   No other specific complaints in a complete review of systems (except as listed in HPI above).   Wt Readings from Last 3 Encounters:  01/07/18 154 lb (69.9 kg)  04/05/17 143 lb (64.9 kg)  10/21/15 129 lb 8 oz (58.7 kg)    General: Appears her stated age, well developed, well nourished in NAD. Pulmonary/Chest:  Normal effort. No respiratory distress.  Abdomen: No obvious distention Pelvic: Deferred as this is a virtual visit. Neurological: Alert and oriented.  Psychiatric: Mood and affect normal. Behavior is normal. Judgment and thought content normal.     BMET    Component Value Date/Time   NA 140 04/05/2017 0951   K 4.2 04/05/2017 0951   CL 105 04/05/2017 0951   CO2 30 04/05/2017 0951   GLUCOSE 70 04/05/2017 0951   BUN 7 04/05/2017 0951   CREATININE 0.75 04/05/2017 0951   CALCIUM 9.6 04/05/2017 0951   GFRNONAA >60 04/01/2008 1243   GFRAA  04/01/2008 1243    >60        The eGFR has been calculated using the MDRD equation. This calculation has not been validated in all clinical situations. eGFR's persistently <60 mL/min signify possible Chronic Kidney Disease.    Lipid Panel     Component Value Date/Time   CHOL 141 04/05/2017 0951   TRIG 81.0 04/05/2017 0951   HDL 55.10 04/05/2017 0951   CHOLHDL 3 04/05/2017 0951   VLDL 16.2 04/05/2017 0951   LDLCALC 70 04/05/2017 0951    CBC    Component Value Date/Time   WBC 5.3 04/05/2017 0951   RBC 4.38 04/05/2017 0951   HGB 13.8 04/05/2017 0951   HCT 42.0 04/05/2017 0951   PLT 152.0 04/05/2017 0951   MCV 95.9 04/05/2017 0951   MCHC 32.9 04/05/2017 0951   RDW 13.3 04/05/2017 0951   LYMPHSABS 1.3 04/01/2008 1243   MONOABS 0.3 04/01/2008 1243   EOSABS 0.0 04/01/2008 1243   BASOSABS 0.0 04/01/2008 1243    Hgb A1C Lab Results  Component Value Date   HGBA1C 4.8 10/21/2015        Assessment and Plan:  Vaginal Discharge:  Will have her set up lab only appt for wet prep Will treat as indicated based upon lab results  Follow Up Instructions:    I discussed the assessment and treatment plan with the patient. The patient was provided an opportunity to ask questions and all were answered. The patient agreed with the plan and demonstrated an understanding of the instructions.   The patient was advised to call back or  seek an in-person evaluation if the symptoms worsen or if the condition fails to improve as anticipated.    Webb Silversmith, NP

## 2018-05-30 ENCOUNTER — Other Ambulatory Visit: Payer: Self-pay | Admitting: Internal Medicine

## 2018-05-30 LAB — WET PREP BY MOLECULAR PROBE
Candida species: NOT DETECTED
MICRO NUMBER:: 413868
SPECIMEN QUALITY:: ADEQUATE
Trichomonas vaginosis: NOT DETECTED

## 2018-05-30 MED ORDER — METRONIDAZOLE 500 MG PO TABS: 500.0000 mg | ORAL_TABLET | Freq: Two times a day (BID) | ORAL | 0 refills | Status: DC

## 2018-07-22 ENCOUNTER — Telehealth: Payer: Self-pay | Admitting: Internal Medicine

## 2018-07-22 NOTE — Telephone Encounter (Signed)
Best number [phone] Pt called wanting to get aTB skin test next nurse 8/23  Pt wanted to come tomorrow can I put her on the schedule?

## 2018-07-23 ENCOUNTER — Other Ambulatory Visit: Payer: Self-pay | Admitting: Internal Medicine

## 2018-07-23 NOTE — Telephone Encounter (Signed)
Pt  Aware of melanie's comment

## 2018-07-23 NOTE — Telephone Encounter (Signed)
We can only read it if it was administered by [place] as that is a liability to not know if it was done properly.

## 2018-07-23 NOTE — Telephone Encounter (Signed)
Pt had her tb test done 6/16 @ [facility] and wanted to know if she could come here to have it read on Thursday

## 2018-07-24 MED ORDER — NORETHINDRONE ACET-ETHINYL EST 1-20 MG-MCG PO TABS: 1.0000 | ORAL_TABLET | Freq: Every day | ORAL | 0 refills | Status: DC

## 2018-07-31 ENCOUNTER — Telehealth: Payer: Self-pay | Admitting: Internal Medicine

## 2018-07-31 NOTE — Telephone Encounter (Signed)
Patient stated that at her physical visit there was some type of medical form filled out by the doctor to keep in her file. She is needing to see if she could have a copy sent over to her employer  [place] [phone]   Patient's C/B # [phone]

## 2018-08-01 NOTE — Telephone Encounter (Signed)
Will fax again as pt said they did not receive it, and place the copy in the front office for pick up

## 2018-08-01 NOTE — Telephone Encounter (Signed)
Pt calling back to see if form has been faxed   [employer] number [phone]  Please advise

## 2018-09-20 ENCOUNTER — Other Ambulatory Visit: Payer: Self-pay | Admitting: Internal Medicine

## 2018-10-12 ENCOUNTER — Other Ambulatory Visit: Payer: Self-pay | Admitting: Internal Medicine

## 2018-10-28 ENCOUNTER — Other Ambulatory Visit: Payer: Self-pay | Admitting: Internal Medicine

## 2018-11-26 ENCOUNTER — Other Ambulatory Visit: Payer: Self-pay | Admitting: Internal Medicine

## 2018-12-13 ENCOUNTER — Other Ambulatory Visit: Payer: Self-pay

## 2018-12-13 DIAGNOSIS — Z20822 Contact with and (suspected) exposure to covid-19: Secondary | ICD-10-CM

## 2018-12-14 LAB — NOVEL CORONAVIRUS, NAA: SARS-CoV-2, NAA: NOT DETECTED

## 2019-03-05 ENCOUNTER — Other Ambulatory Visit: Payer: Self-pay | Admitting: Internal Medicine

## 2019-03-30 ENCOUNTER — Other Ambulatory Visit: Payer: Self-pay | Admitting: Internal Medicine

## 2019-05-25 ENCOUNTER — Other Ambulatory Visit: Payer: Self-pay | Admitting: Internal Medicine

## 2019-06-15 ENCOUNTER — Other Ambulatory Visit: Payer: Self-pay | Admitting: Internal Medicine

## 2019-06-16 NOTE — Telephone Encounter (Addendum)
LOV with PCP was 05/29/2018 - video visit. Per note on last refill "pt must schedule CPE" Last CPE was 04/05/2017.

## 2019-06-17 NOTE — Telephone Encounter (Signed)
Pt contacted to schedule an appointment.   Pt stated that she is not currently taking oral BC or using any BC at the moment.   Pt stated that she will call back to schedule CPE with PCP.

## 2019-10-15 LAB — OB RESULTS CONSOLE RPR: RPR: NONREACTIVE

## 2019-10-15 LAB — OB RESULTS CONSOLE GC/CHLAMYDIA
Chlamydia: NEGATIVE
Gonorrhea: NEGATIVE

## 2019-10-15 LAB — OB RESULTS CONSOLE RUBELLA ANTIBODY, IGM: Rubella: IMMUNE

## 2019-10-15 LAB — OB RESULTS CONSOLE HIV ANTIBODY (ROUTINE TESTING): HIV: NONREACTIVE

## 2019-10-15 LAB — OB RESULTS CONSOLE HEPATITIS B SURFACE ANTIGEN: Hepatitis B Surface Ag: NEGATIVE

## 2020-02-07 NOTE — L&D Delivery Note (Signed)
DELIVERY NOTE  Pt complete and at +2 station with urge to push. Epidural controlling pain. Pt pushed and delivered a viable female infant in OP position.Loose nuchal x1, reduced. Anterior and posterior shoulders spontaneously delivered with next two pushes; body easily followed next. Infant placed on mothers abdomen and bulb suction of mouth and nose performed. Cord was then clamped and cut by FOB. Cord blood obtained, 3VC. Baby had a vigorous spontaneous cry noted. Placenta then delivered at 2020 intact. Fundal massage performed and pitocin per protocol. Fundus firm. The following lacerations were noted: 2nd degree, BL sulcla; Significant edema noted prior to edema, resolving at end. EBL 600cc Repaired in routine fashion with 2-0 and 3-0 vicryl Mother and baby stable. Counts correct   Infant time: 2016 Gender:female, desires circ Placenta time: 2020 Apgars: 9/9 Weight: pending skin-to-skin

## 2020-04-22 LAB — OB RESULTS CONSOLE GBS: GBS: NEGATIVE

## 2020-04-28 ENCOUNTER — Inpatient Hospital Stay (HOSPITAL_COMMUNITY): Payer: BC Managed Care – PPO

## 2020-04-28 ENCOUNTER — Inpatient Hospital Stay (HOSPITAL_COMMUNITY): Payer: BC Managed Care – PPO | Admitting: Anesthesiology

## 2020-04-28 ENCOUNTER — Inpatient Hospital Stay (HOSPITAL_COMMUNITY)
Admission: AD | Admit: 2020-04-28 | Discharge: 2020-04-30 | DRG: 807 | Disposition: A | Discharge status: Home / Self Care | Payer: BC Managed Care – PPO | Attending: Obstetrics and Gynecology | Admitting: Obstetrics and Gynecology

## 2020-04-28 ENCOUNTER — Other Ambulatory Visit: Payer: Self-pay

## 2020-04-28 ENCOUNTER — Encounter (HOSPITAL_COMMUNITY): Payer: Self-pay | Admitting: Obstetrics and Gynecology

## 2020-04-28 ENCOUNTER — Other Ambulatory Visit: Payer: Self-pay | Admitting: Obstetrics and Gynecology

## 2020-04-28 DIAGNOSIS — Z3A37 37 weeks gestation of pregnancy: Secondary | ICD-10-CM | POA: Diagnosis not present

## 2020-04-28 DIAGNOSIS — O1490 Unspecified pre-eclampsia, unspecified trimester: Secondary | ICD-10-CM | POA: Diagnosis present

## 2020-04-28 DIAGNOSIS — O134 Gestational [pregnancy-induced] hypertension without significant proteinuria, complicating childbirth: Secondary | ICD-10-CM | POA: Diagnosis present

## 2020-04-28 DIAGNOSIS — Z8616 Personal history of COVID-19: Secondary | ICD-10-CM

## 2020-04-28 LAB — TYPE AND SCREEN
ABO/RH(D): B POS
Antibody Screen: NEGATIVE

## 2020-04-28 LAB — COMPREHENSIVE METABOLIC PANEL
ALT: 14 U/L (ref 0–44)
AST: 22 U/L (ref 15–41)
Albumin: 3 g/dL — ABNORMAL LOW (ref 3.5–5.0)
Alkaline Phosphatase: 182 U/L — ABNORMAL HIGH (ref 38–126)
Anion gap: 8 (ref 5–15)
BUN: <5 mg/dL — ABNORMAL LOW (ref 6–20)
CO2: 20 mmol/L — ABNORMAL LOW (ref 22–32)
Calcium: 9.4 mg/dL (ref 8.9–10.3)
Chloride: 108 mmol/L (ref 98–111)
Creatinine, Ser: 0.61 mg/dL (ref 0.44–1.00)
GFR, Estimated: >60 mL/min (ref >60)
Glucose, Bld: 82 mg/dL (ref 70–99)
Potassium: 3.9 mmol/L (ref 3.5–5.1)
Sodium: 136 mmol/L (ref 135–145)
Total Bilirubin: 1 mg/dL (ref 0.3–1.2)
Total Protein: 6.1 g/dL — ABNORMAL LOW (ref 6.5–8.1)

## 2020-04-28 LAB — CBC
HCT: 34.3 % — ABNORMAL LOW (ref 36.0–46.0)
HCT: 32.2 % — ABNORMAL LOW (ref 36.0–46.0)
Hemoglobin: 11 g/dL — ABNORMAL LOW (ref 12.0–15.0)
Hemoglobin: 10.5 g/dL — ABNORMAL LOW (ref 12.0–15.0)
MCH: 30.4 pg (ref 26.0–34.0)
MCH: 31.4 pg (ref 26.0–34.0)
MCHC: 32.1 g/dL (ref 30.0–36.0)
MCHC: 32.6 g/dL (ref 30.0–36.0)
MCV: 94.8 fL (ref 80.0–100.0)
MCV: 96.4 fL (ref 80.0–100.0)
Platelets: 111 10*3/uL — ABNORMAL LOW (ref 150–400)
Platelets: 113 10*3/uL — ABNORMAL LOW (ref 150–400)
RBC: 3.62 MIL/uL — ABNORMAL LOW (ref 3.87–5.11)
RBC: 3.34 MIL/uL — ABNORMAL LOW (ref 3.87–5.11)
RDW: 13.9 % (ref 11.5–15.5)
RDW: 14 % (ref 11.5–15.5)
WBC: 9.6 10*3/uL (ref 4.0–10.5)
WBC: 21.4 10*3/uL — ABNORMAL HIGH (ref 4.0–10.5)
nRBC: 0 % (ref 0.0–0.2)
nRBC: 0 % (ref 0.0–0.2)

## 2020-04-28 LAB — RPR: RPR Ser Ql: NONREACTIVE

## 2020-04-28 LAB — URIC ACID: Uric Acid, Serum: 4.4 mg/dL (ref 2.5–7.1)

## 2020-04-28 LAB — RESP PANEL BY RT-PCR (FLU A&B, COVID) ARPGX2
Influenza A by PCR: NEGATIVE
Influenza B by PCR: NEGATIVE
SARS Coronavirus 2 by RT PCR: NEGATIVE

## 2020-04-28 LAB — LACTATE DEHYDROGENASE: LDH: 147 U/L (ref 98–192)

## 2020-04-28 MED ORDER — OXYTOCIN-SODIUM CHLORIDE 30-0.9 UT/500ML-% IV SOLN
2.5000 [IU]/h | INTRAVENOUS | Status: DC
  Administered 2020-04-28: 2.5 [IU]/h via INTRAVENOUS

## 2020-04-28 MED ORDER — SOD CITRATE-CITRIC ACID 500-334 MG/5ML PO SOLN: 30.0000 mL | ORAL | Status: DC | PRN

## 2020-04-28 MED ORDER — TERBUTALINE SULFATE 1 MG/ML IJ SOLN: 0.2500 mg | Freq: Once | INTRAMUSCULAR | Status: DC | PRN

## 2020-04-28 MED ORDER — DIPHENHYDRAMINE HCL 25 MG PO CAPS: 25.0000 mg | ORAL_CAPSULE | Freq: Four times a day (QID) | ORAL | Status: DC | PRN

## 2020-04-28 MED ORDER — EPHEDRINE 5 MG/ML INJ: 10.0000 mg | INTRAVENOUS | Status: DC | PRN

## 2020-04-28 MED ORDER — BENZOCAINE-MENTHOL 20-0.5 % EX AERO
1.0000 "application " | INHALATION_SPRAY | CUTANEOUS | Status: DC | PRN
  Administered 2020-04-29 – 2020-04-30 (×2): 1 via TOPICAL
  Filled 2020-04-28 (×2): qty 56

## 2020-04-28 MED ORDER — LACTATED RINGERS IV SOLN: 500.0000 mL | Freq: Once | INTRAVENOUS | Status: DC

## 2020-04-28 MED ORDER — PHENYLEPHRINE 40 MCG/ML (10ML) SYRINGE FOR IV PUSH (FOR BLOOD PRESSURE SUPPORT): 80.0000 ug | PREFILLED_SYRINGE | INTRAVENOUS | Status: DC | PRN

## 2020-04-28 MED ORDER — COCONUT OIL OIL
1.0000 "application " | TOPICAL_OIL | Status: DC | PRN
  Administered 2020-04-29: 1 via TOPICAL

## 2020-04-28 MED ORDER — OXYTOCIN-SODIUM CHLORIDE 30-0.9 UT/500ML-% IV SOLN
1.0000 m[IU]/min | INTRAVENOUS | Status: DC
  Administered 2020-04-28: 2 m[IU]/min via INTRAVENOUS
  Filled 2020-04-28: qty 500

## 2020-04-28 MED ORDER — OXYTOCIN BOLUS FROM INFUSION
333.0000 mL | Freq: Once | INTRAVENOUS | Status: AC
  Administered 2020-04-28: 333 mL via INTRAVENOUS

## 2020-04-28 MED ORDER — ONDANSETRON HCL 4 MG PO TABS: 4.0000 mg | ORAL_TABLET | ORAL | Status: DC | PRN

## 2020-04-28 MED ORDER — SENNOSIDES-DOCUSATE SODIUM 8.6-50 MG PO TABS
2.0000 | ORAL_TABLET | Freq: Every day | ORAL | Status: DC
  Administered 2020-04-29 – 2020-04-30 (×2): 2 via ORAL
  Filled 2020-04-28 (×2): qty 2

## 2020-04-28 MED ORDER — ONDANSETRON HCL 4 MG/2ML IJ SOLN
4.0000 mg | Freq: Four times a day (QID) | INTRAMUSCULAR | Status: DC | PRN
  Administered 2020-04-28: 4 mg via INTRAVENOUS
  Filled 2020-04-28: qty 2

## 2020-04-28 MED ORDER — OXYCODONE-ACETAMINOPHEN 5-325 MG PO TABS: 2.0000 | ORAL_TABLET | ORAL | Status: DC | PRN

## 2020-04-28 MED ORDER — FENTANYL-BUPIVACAINE-NACL 0.5-0.125-0.9 MG/250ML-% EP SOLN
12.0000 mL/h | EPIDURAL | Status: DC | PRN
Start: 2020-04-28 — End: 2020-04-28
  Administered 2020-04-28: 12 mL/h via EPIDURAL
  Filled 2020-04-28: qty 250

## 2020-04-28 MED ORDER — DIPHENHYDRAMINE HCL 50 MG/ML IJ SOLN: 12.5000 mg | INTRAMUSCULAR | Status: DC | PRN

## 2020-04-28 MED ORDER — LIDOCAINE HCL (PF) 1 % IJ SOLN
INTRAMUSCULAR | Status: DC | PRN
  Administered 2020-04-28: 8 mL via EPIDURAL

## 2020-04-28 MED ORDER — ACETAMINOPHEN 325 MG PO TABS: 650.0000 mg | ORAL_TABLET | ORAL | Status: DC | PRN

## 2020-04-28 MED ORDER — LACTATED RINGERS IV SOLN: 500.0000 mL | INTRAVENOUS | Status: DC | PRN

## 2020-04-28 MED ORDER — ONDANSETRON HCL 4 MG/2ML IJ SOLN: 4.0000 mg | INTRAMUSCULAR | Status: DC | PRN

## 2020-04-28 MED ORDER — PRENATAL MULTIVITAMIN CH
1.0000 | ORAL_TABLET | Freq: Every day | ORAL | Status: DC
  Administered 2020-04-29: 1 via ORAL
  Filled 2020-04-28: qty 1

## 2020-04-28 MED ORDER — WITCH HAZEL-GLYCERIN EX PADS: 1.0000 "application " | MEDICATED_PAD | CUTANEOUS | Status: DC | PRN

## 2020-04-28 MED ORDER — ZOLPIDEM TARTRATE 5 MG PO TABS: 5.0000 mg | ORAL_TABLET | Freq: Every evening | ORAL | Status: DC | PRN

## 2020-04-28 MED ORDER — PHENYLEPHRINE 40 MCG/ML (10ML) SYRINGE FOR IV PUSH (FOR BLOOD PRESSURE SUPPORT)
80.0000 ug | PREFILLED_SYRINGE | INTRAVENOUS | Status: DC | PRN
  Filled 2020-04-28: qty 10

## 2020-04-28 MED ORDER — OXYCODONE-ACETAMINOPHEN 5-325 MG PO TABS: 1.0000 | ORAL_TABLET | ORAL | Status: DC | PRN

## 2020-04-28 MED ORDER — IBUPROFEN 600 MG PO TABS
600.0000 mg | ORAL_TABLET | Freq: Four times a day (QID) | ORAL | Status: DC
  Administered 2020-04-28 – 2020-04-30 (×6): 600 mg via ORAL
  Filled 2020-04-28 (×6): qty 1

## 2020-04-28 MED ORDER — SIMETHICONE 80 MG PO CHEW: 80.0000 mg | CHEWABLE_TABLET | ORAL | Status: DC | PRN

## 2020-04-28 MED ORDER — BUTORPHANOL TARTRATE 1 MG/ML IJ SOLN: 1.0000 mg | INTRAMUSCULAR | Status: DC | PRN

## 2020-04-28 MED ORDER — DIBUCAINE (PERIANAL) 1 % EX OINT: 1.0000 "application " | TOPICAL_OINTMENT | CUTANEOUS | Status: DC | PRN

## 2020-04-28 MED ORDER — LACTATED RINGERS IV SOLN: INTRAVENOUS | Status: DC

## 2020-04-28 MED ORDER — TETANUS-DIPHTH-ACELL PERTUSSIS 5-2.5-18.5 LF-MCG/0.5 IM SUSY: 0.5000 mL | PREFILLED_SYRINGE | Freq: Once | INTRAMUSCULAR | Status: DC

## 2020-04-28 MED ORDER — LIDOCAINE HCL (PF) 1 % IJ SOLN: 30.0000 mL | INTRAMUSCULAR | Status: DC | PRN

## 2020-04-28 NOTE — Progress Notes (Signed)
PreE labs ordered stat on admission: 11/34.3/111, Cr 0.61, AST/ALT 22/14 uPC 0.544 yesterday  Irr ctxs at this time. Denies PreE symptoms BP 131/76   Pulse 71   Temp 98.7 F (37.1 C) (Oral)   Resp 17   Ht 5\' 2"  (1.575 m)   Wt 77.5 kg   BMI 31.24 kg/m  Clear AROM @ 1230, CE 3/70/-1, TOCO irregular, pitocin at 30mU/min  Continue to titrate pitocin per protocol. PreE w.o SF, continue to monitor symptomatically. Desires epidural, will call. Anticipate SVD

## 2020-04-28 NOTE — Anesthesia Procedure Notes (Signed)
Epidural Patient location during procedure: OB Start time: 04/28/2020 12:47 PM End time: 04/28/2020 12:56 PM  Staffing Anesthesiologist: Mellody Dance, MD Performed: anesthesiologist   Preanesthetic Checklist Completed: patient identified, IV checked, site marked, risks and benefits discussed, monitors and equipment checked, pre-op evaluation and timeout performed  Epidural Patient position: sitting Prep: DuraPrep Patient monitoring: heart rate, cardiac monitor, continuous pulse ox and blood pressure Approach: midline Location: L2-L3 Injection technique: LOR saline  Needle:  Needle type: Tuohy  Needle gauge: 17 G Needle length: 9 cm Needle insertion depth: 5.5 cm Catheter type: closed end flexible Catheter size: 20 Guage Catheter at skin depth: 10.5 cm Test dose: negative and Other  Assessment Events: blood not aspirated, injection not painful, no injection resistance and negative IV test  Additional Notes Informed consent obtained prior to proceeding including risk of failure, 1% risk of PDPH, risk of minor discomfort and bruising.  Discussed rare but serious complications including epidural abscess, permanent nerve injury, epidural hematoma.  Discussed alternatives to epidural analgesia and patient desires to proceed.  Timeout performed pre-procedure verifying patient name, procedure, and platelet count.  Patient tolerated procedure well.

## 2020-04-28 NOTE — H&P (Signed)
Margaret Calderon is a 30 y.o. female presenting for IOL. Found to have mildly elevated Bps 140s/90s last two weeks and met criteria for GHTN. Stat PreE labs yesterday returned with uPC 0.54 (LFTS WNL, CBC was pending), therefore patient added today to IOL for PreE. Denies HA, RUQ pain, SOB, visual chagnes today. Some LE edema.   PNC otherwise uncomplicated. Was COVID pos on 1/29, asx today. S/o vaccine x2. GBS neg OB History    Gravida  1   Para      Term      Preterm      AB      Living        SAB      IAB      Ectopic      Multiple      Live Births             Past Medical History:  Diagnosis Date  . History of chicken pox    Past Surgical History:  Procedure Laterality Date  . NO PAST SURGERIES     Family History: family history includes Alcohol abuse in an other family member; Heart disease in an other family member; Hypertension in her mother and another family member; Stroke in an other family member. Social History:  reports that she has never smoked. She has never used smokeless tobacco. She reports current alcohol use. She reports that she does not use drugs.      Maternal Diabetes: No 1hr 113 Genetic Screening: Declined Maternal Ultrasounds/Referrals: Normal Fetal Ultrasounds or other Referrals:  None Maternal Substance Abuse:  No Significant Maternal Medications:  None Significant Maternal Lab Results:  Group B Strep negative Other Comments:  None  Review of Systems  Constitutional: Negative for chills and fever.  Respiratory: Negative for shortness of breath.   Cardiovascular: Negative for chest pain, palpitations and leg swelling.  Gastrointestinal: Negative for abdominal pain and vomiting.  Neurological: Negative for dizziness, weakness and headaches.  Psychiatric/Behavioral: Negative for suicidal ideas.   Maternal Medical History:  Contractions: Frequency: rare.    Fetal activity: Perceived fetal activity is normal.   Last perceived fetal  movement was within the past hour.    Prenatal complications: Pre-eclampsia.   No bleeding or cholelithiasis.   Prenatal Complications - Diabetes: none.      There were no vitals taken for this visit. Exam Physical Exam Constitutional:      General: She is not in acute distress.    Appearance: She is well-developed.  HENT:     Head: Normocephalic and atraumatic.  Eyes:     Pupils: Pupils are equal, round, and reactive to light.  Cardiovascular:     Rate and Rhythm: Normal rate and regular rhythm.     Heart sounds: No murmur heard. No gallop.   Abdominal:     Tenderness: There is no abdominal tenderness. There is no guarding or rebound.  Genitourinary:    Vagina: Normal.  Musculoskeletal:        General: Normal range of motion.     Cervical back: Normal range of motion and neck supple.  Skin:    General: Skin is warm and dry.  Neurological:     Mental Status: She is alert and oriented to person, place, and time.     CE 3/70/-2 Category 1 tracing TOCO neg Prenatal labs: ABO, Rh:  Bpos Antibody:  neg Rubella:  imm RPR:   nr HBsAg:   neg HIV:   nr GBS:   neg  Assessment/Plan: This is a 30yo G1P0 @ 52 1/7 by LMP c/w 9wk TVUS admitted for IOL for newly diagnosed PreE. Diagnosed with mildly elevated Bps in office 140s/90 plus uPC on stat lab last night of 0.544. CBC did not result until this AM, notable for platelet for 97. Will repeat stat this AM to confirm. IF so, will meet criteria for PreE w/ SF and will require MgSO4 for seizure ppx. Discussed this in detail with patient. Baby boy, desires circ.    Margaret Calderon 04/28/2020, 9:28 AM

## 2020-04-28 NOTE — Lactation Note (Signed)
This note was copied from a baby's chart. Lactation Consultation Note  Patient Name: Margaret Calderon [id number] Date: 04/28/2020 Reason for consult: L&D Initial assessment;Mother's request;Primapara;1st time breastfeeding;Early term 37-38.6wks;Other (Comment) (Gest HTN ( no meds)) Age: < 1hr  LC assisted Mom latching infant on the left breast in cross cradle. Signs of milk transfer noted.  LC reviewed feeding cues with Mom. 8-12x in 24 hrs no more than 3 hrs without an attempt.  Mom to receive further LC support on the floor.    Maternal Data Has patient been taught Hand Expression?: Yes Does the patient have breastfeeding experience prior to this delivery?: No  Feeding Mother's Current Feeding Choice: Breast Milk  LATCH Score Latch: Repeated attempts needed to sustain latch, nipple held in mouth throughout feeding, stimulation needed to elicit sucking reflex.  Audible Swallowing: Spontaneous and intermittent  Type of Nipple: Everted at rest and after stimulation  Comfort (Breast/Nipple): Soft / non-tender  Hold (Positioning): Assistance needed to correctly position infant at breast and maintain latch.  LATCH Score: 8   Lactation Tools Discussed/Used    Interventions Interventions: Breast feeding basics reviewed;Assisted with latch;Support pillows;Education;Skin to skin;Expressed milk;Hand express;Breast compression;Adjust position  Discharge    Consult Status Consult Status: Follow-up Date: 04/29/20 Follow-up type: In-patient    Shaunae Sieloff  Nicholson-Springer 04/28/2020, 9:21 PM

## 2020-04-28 NOTE — Anesthesia Preprocedure Evaluation (Signed)
Anesthesia Evaluation  Patient identified by MRN, date of birth, ID band Patient awake    Reviewed: Allergy & Precautions, NPO status , Patient's Chart, lab work & pertinent test results  Airway Mallampati: II  TM Distance: >3 FB Neck ROM: Full    Dental no notable dental hx.    Pulmonary neg pulmonary ROS,    Pulmonary exam normal breath sounds clear to auscultation       Cardiovascular hypertension (preeclampsia), Normal cardiovascular exam Rhythm:Regular Rate:Normal     Neuro/Psych negative neurological ROS  negative psych ROS   GI/Hepatic negative GI ROS, Neg liver ROS,   Endo/Other  negative endocrine ROS  Renal/GU negative Renal ROS  negative genitourinary   Musculoskeletal negative musculoskeletal ROS (+)   Abdominal   Peds negative pediatric ROS (+)  Hematology  (+) Blood dyscrasia (platelets 111), anemia ,   Anesthesia Other Findings   Reproductive/Obstetrics (+) Pregnancy                             Anesthesia Physical Anesthesia Plan  ASA: III  Anesthesia Plan: Epidural   Post-op Pain Management:    Induction:   PONV Risk Score and Plan: 2 and Treatment may vary due to age or medical condition  Airway Management Planned: Natural Airway  Additional Equipment:   Intra-op Plan:   Post-operative Plan:   Informed Consent: I have reviewed the patients History and Physical, chart, labs and discussed the procedure including the risks, benefits and alternatives for the proposed anesthesia with the patient or authorized representative who has indicated his/her understanding and acceptance.       Plan Discussed with: Anesthesiologist  Anesthesia Plan Comments:         Anesthesia Quick Evaluation

## 2020-04-29 LAB — CBC
HCT: 24 % — ABNORMAL LOW (ref 36.0–46.0)
Hemoglobin: 7.9 g/dL — ABNORMAL LOW (ref 12.0–15.0)
MCH: 31.3 pg (ref 26.0–34.0)
MCHC: 32.9 g/dL (ref 30.0–36.0)
MCV: 95.2 fL (ref 80.0–100.0)
Platelets: 106 10*3/uL — ABNORMAL LOW (ref 150–400)
RBC: 2.52 MIL/uL — ABNORMAL LOW (ref 3.87–5.11)
RDW: 14 % (ref 11.5–15.5)
WBC: 16.7 10*3/uL — ABNORMAL HIGH (ref 4.0–10.5)
nRBC: 0 % (ref 0.0–0.2)

## 2020-04-29 MED ORDER — POLYSACCHARIDE IRON COMPLEX 150 MG PO CAPS
150.0000 mg | ORAL_CAPSULE | Freq: Every day | ORAL | Status: DC
  Administered 2020-04-30: 150 mg via ORAL
  Filled 2020-04-29: qty 1

## 2020-04-29 NOTE — Progress Notes (Signed)
Post Partum Day 1 Subjective: no complaints, up ad lib and tolerating PO  Objective: Blood pressure 122/79, pulse 79, temperature 98 F (36.7 C), temperature source Oral, resp. rate 18, height 5\' 2"  (1.575 m), weight 77.5 kg, SpO2 100 %, unknown if currently breastfeeding.  Physical Exam:  General: alert and cooperative Lochia: appropriate Uterine Fundus: firm   Recent Labs    04/28/20 2140 04/29/20 0649  HGB 10.5* 7.9*  HCT 32.2* 24.0*    Assessment/Plan: BP all WNL thus far. Platelets stable at 106K Relative anemia, but tolerating well.  Will start po iron. Desires circumcision   LOS: 1 day   05/01/20 04/29/2020, 10:41 AM

## 2020-04-29 NOTE — Anesthesia Postprocedure Evaluation (Signed)
Anesthesia Post Note  Patient: Teacher, English as a foreign language  Procedure(s) Performed: AN AD HOC LABOR EPIDURAL     Patient location during evaluation: Mother Baby Anesthesia Type: Epidural Level of consciousness: awake, awake and alert and oriented Pain management: pain level controlled Vital Signs Assessment: post-procedure vital signs reviewed and stable Respiratory status: spontaneous breathing and respiratory function stable Cardiovascular status: blood pressure returned to baseline Postop Assessment: no headache, epidural receding, patient able to bend at knees, adequate PO intake, no backache, no apparent nausea or vomiting and able to ambulate Anesthetic complications: no   No complications documented.  Last Vitals:  Vitals:   04/29/20 0340 04/29/20 0747  BP: 119/69 122/79  Pulse: 79 79  Resp: 18 18  Temp: 37 C 36.7 C  SpO2:      Last Pain:  Vitals:   04/29/20 0747  TempSrc: Oral  PainSc:    Pain Goal:                Epidural/Spinal Function Cutaneous sensation: Normal sensation (04/29/20 0745), Patient able to flex knees: Yes (04/29/20 0745), Patient able to lift hips off bed: Yes (04/29/20 0745), Back pain beyond tenderness at insertion site: No (04/29/20 0745), Progressively worsening motor and/or sensory loss: No (04/29/20 0745), Bowel and/or bladder incontinence post epidural: No (04/29/20 0745)  Cleda Clarks

## 2020-04-30 LAB — SURGICAL PATHOLOGY

## 2020-04-30 MED ORDER — IBUPROFEN 600 MG PO TABS: 600.0000 mg | ORAL_TABLET | Freq: Four times a day (QID) | ORAL | 1 refills | Status: DC | PRN

## 2020-04-30 NOTE — Lactation Note (Signed)
This note was copied from a baby's chart. Lactation Consultation Note  Patient Name: Margaret Calderon [id number] Date: 04/30/2020 Reason for consult: Follow-up assessment;Primapara;1st time breastfeeding;Early term 37-38.6wks Age:31 hours  Baby awake and rooting, mom latched the baby and LC showed mom how to flip the upper lip so its more flanged and ease down the chin to increase depth. Increased swallows noted with breast compressions and per mom more comfortable. LC noted mom to have areola edema , and provided shells and a hand pump.  LC reviewed breast feeding basics. Mom and dad expressed feeling more reassured after discussion .  LC provided the [facility] brochure with resources.  Latch Score 9   Maternal Data Has patient been taught Hand Expression?: Yes  Feeding Mother's Current Feeding Choice: Breast Milk  LATCH Score Latch: Grasps breast easily, tongue down, lips flanged, rhythmical sucking.  Audible Swallowing: Spontaneous and intermittent  Type of Nipple: Everted at rest and after stimulation  Comfort (Breast/Nipple): Soft / non-tender  Hold (Positioning): Assistance needed to correctly position infant at breast and maintain latch.  LATCH Score: 9   Lactation Tools Discussed/Used  breast shells and hand pump with #24 F and #27 F   Interventions Interventions: Assisted with latch;Skin to skin;Breast compression;Adjust position;Support pillows;Breast feeding basics reviewed;Position options Education  Discharge    Consult Status Consult Status: Follow-up Date: 04/30/20 Follow-up type: In-patient    [name] 04/30/2020, 9:44 AM

## 2020-04-30 NOTE — Progress Notes (Signed)
Post Partum Day 1 Subjective: no complaints, up ad lib, voiding, tolerating PO, + flatus and lochia mild. Pt reports still feels "swollen" in vaginal area though pain controlled. She idenies HA, CP, SOB ; thinks had floaters in vision yesterday. Bonding well with baby. Ready for discharge to home today  Objective: Blood pressure 126/65, pulse 85, temperature 98 F (36.7 C), temperature source Oral, resp. rate 16, height 5\' 2"  (1.575 m), weight 77.5 kg, SpO2 99 %, unknown if currently breastfeeding.  Physical Exam:  General: alert, cooperative and no distress Lochia: appropriate Uterine Fundus: firm Incision: n/a DVT Evaluation: No evidence of DVT seen on physical exam. No significant calf/ankle edema.  Recent Labs    04/28/20 2140 04/29/20 0649  HGB 10.5* 7.9*  HCT 32.2* 24.0*    Assessment/Plan: Discharge home and Breastfeeding  BP check in a week and postpartum visit ini 6 weeks   LOS: 2 days   Margaret Calderon 04/30/2020, 10:23 AM

## 2020-04-30 NOTE — Lactation Note (Signed)
This note was copied from a baby's chart. Lactation Consultation Note LC attempted to see mom. Mom sleeping.  Patient Name: Margaret Calderon [id number] Date: 04/30/2020   Age:31 hours  Maternal Data    Feeding    LATCH Score                    Lactation Tools Discussed/Used    Interventions    Discharge    Consult Status      [name] 04/30/2020, 4:42 AM

## 2020-04-30 NOTE — Discharge Summary (Signed)
Postpartum Discharge Summary  Date of Service updated      Patient Name: Margaret Calderon DOB: 03/01/1990 MRN: [id number]  Date of admission: 04/28/2020 Delivery date:04/28/2020  Delivering provider: Carlisle Cater  Date of discharge: 04/30/2020  Admitting diagnosis: Preeclampsia [O14.90] Intrauterine pregnancy: [redacted]w[redacted]d     Secondary diagnosis:  Active Problems:   Preeclampsia  Additional problems: none    Discharge diagnosis: Term Pregnancy Delivered and Gestational Hypertension                                              Post partum procedures:n/a Augmentation: Pitocin Complications: None  Hospital course: Induction of Labor With Vaginal Delivery   30 y.o. yo [name] at [redacted]w[redacted]d was admitted to the hospital 04/28/2020 for induction of labor.  Indication for induction: Gestational hypertension.  Patient had an uncomplicated labor course as follows: Membrane Rupture Time/Date: 12:27 PM ,04/28/2020   Delivery Method:Vaginal, Spontaneous  Episiotomy: None  Lacerations:  2nd degree;Perineal  Details of delivery can be found in separate delivery note.  Patient had a routine postpartum course. Patient is discharged home 04/30/20.  Newborn Data: Birth date:04/28/2020  Birth time:8:16 PM  Gender:Female  Living status:Living  Apgars:8 ,9  Weight:2960 g   Magnesium Sulfate received: No BMZ received: No Rhophylac:N/A  Physical exam  Vitals:   04/29/20 1259 04/29/20 1532 04/29/20 2018 04/30/20 0500  BP: 124/66 128/70 124/68 126/65  Pulse: 77 74 91 85  Resp: 18 18 15 16   Temp: 97.9 F (36.6 C) 98.8 F (37.1 C) 97.9 F (36.6 C) 98 F (36.7 C)  TempSrc: Oral Oral Oral Oral  SpO2:  100% 99% 99%  Weight:      Height:       General: alert, cooperative and no distress Lochia: appropriate Uterine Fundus: firm Incision: N/A DVT Evaluation: No evidence of DVT seen on physical exam. Labs: Lab Results  Component Value Date   WBC 16.7 (H) 04/29/2020   HGB 7.9 (L) 04/29/2020    HCT 24.0 (L) 04/29/2020   MCV 95.2 04/29/2020   PLT 106 (L) 04/29/2020   CMP Latest Ref Rng & Units 04/28/2020  Glucose 70 - 99 mg/dL 82  BUN 6 - 20 mg/dL 04/30/2020)  Creatinine <3(O - 1.00 mg/dL 6.71  Sodium 2.45 - 809 mmol/L 136  Potassium 3.5 - 5.1 mmol/L 3.9  Chloride 98 - 111 mmol/L 108  CO2 22 - 32 mmol/L 20(L)  Calcium 8.9 - 10.3 mg/dL 9.4  Total Protein 6.5 - 8.1 g/dL 6.1(L)  Total Bilirubin 0.3 - 1.2 mg/dL 1.0  Alkaline Phos 38 - 126 U/L 182(H)  AST 15 - 41 U/L 22  ALT 0 - 44 U/L 14   Edinburgh Score: Edinburgh Postnatal Depression Scale Screening Tool 04/30/2020  I have been able to laugh and see the funny side of things. 0  I have looked forward with enjoyment to things. 0  I have blamed myself unnecessarily when things went wrong. 0  I have been anxious or worried for no good reason. 0  I have felt scared or panicky for no good reason. 0  Things have been getting on top of me. 0  I have been so unhappy that I have had difficulty sleeping. 0  I have felt sad or miserable. 0  I have been so unhappy that I have been crying. 0  The thought  of harming myself has occurred to me. 0  Edinburgh Postnatal Depression Scale Total 0      After visit meds:  Allergies as of 04/30/2020   No Known Allergies     Medication List    TAKE these medications   Breathe Right Strp 1 each by Does not apply route at bedtime.   ibuprofen 600 MG tablet Commonly known as: ADVIL Take 1 tablet (600 mg total) by mouth every 6 (six) hours as needed for cramping.   omeprazole 20 MG capsule Commonly known as: PRILOSEC Take 20 mg by mouth daily.   prenatal multivitamin Tabs tablet Take 1 tablet by mouth daily at 12 noon.        Discharge home in stable condition Infant Feeding: Breast Infant Disposition:home with mother Discharge instruction: per After Visit Summary and Postpartum booklet. Activity: Advance as tolerated. Pelvic rest for 6 weeks.  Diet: low salt diet Anticipated  Birth Control: Unsure Postpartum Appointment:6 weeks Additional Postpartum F/U: BP check 1 week Future Appointments:No future appointments. Follow up Visit:  Follow-up Information    Shivaji, [name], MD. Schedule an appointment as soon as possible for a visit.   Specialty: Obstetrics and Gynecology Why: BP check in a week and postpartum visit ini 6 weeks Contact information: [address] [place] [address] [phone]                   04/30/2020 Cathrine Muster, DO

## 2020-04-30 NOTE — Discharge Instructions (Signed)
Call office with any concerns [phone] 

## 2020-05-18 ENCOUNTER — Inpatient Hospital Stay (HOSPITAL_COMMUNITY): Admit: 2020-05-18 | Payer: Self-pay

## 2021-04-25 ENCOUNTER — Encounter: Payer: Self-pay | Admitting: Internal Medicine

## 2021-04-25 ENCOUNTER — Other Ambulatory Visit: Payer: Self-pay

## 2021-04-25 ENCOUNTER — Ambulatory Visit (INDEPENDENT_AMBULATORY_CARE_PROVIDER_SITE_OTHER): Payer: BC Managed Care – PPO | Admitting: Internal Medicine

## 2021-04-25 ENCOUNTER — Other Ambulatory Visit (HOSPITAL_COMMUNITY)
Admission: RE | Admit: 2021-04-25 | Discharge: 2021-04-25 | Disposition: A | Discharge status: Home / Self Care | Payer: BC Managed Care – PPO | Source: Ambulatory Visit | Attending: Internal Medicine | Admitting: Internal Medicine

## 2021-04-25 VITALS — BP 119/67 | HR 57 | Temp 97.1°F | Ht 62.0 in | Wt 131.0 lb

## 2021-04-25 DIAGNOSIS — Z1159 Encounter for screening for other viral diseases: Secondary | ICD-10-CM | POA: Diagnosis not present

## 2021-04-25 DIAGNOSIS — N898 Other specified noninflammatory disorders of vagina: Secondary | ICD-10-CM | POA: Insufficient documentation

## 2021-04-25 DIAGNOSIS — Z0001 Encounter for general adult medical examination with abnormal findings: Secondary | ICD-10-CM

## 2021-04-25 MED ORDER — NORETHIN ACE-ETH ESTRAD-FE 1-20 MG-MCG PO TABS
1.0000 | ORAL_TABLET | Freq: Every day | ORAL | 11 refills | Status: DC
Start: 2021-04-25 — End: 2022-01-24

## 2021-04-25 NOTE — Patient Instructions (Signed)

## 2021-04-25 NOTE — Progress Notes (Signed)
? ?Subjective:  ? ? Patient ID: Margaret Calderon, female    DOB: September 20, 1990, 31 y.o.   MRN: [id number] ? ?HPI ? ?Pt presents to the clinic today for her annual exam. ? ?Flu: 11/2019 ?Tetanus: 2021 ?Covid: Pfizer x 2 ?Pap smear: 10/2015 ?Dentist: biannually ? ?Diet: She does eat meat. She consumes fruits and veggies. She tries to avoid fried foods. She drinks mostly water. ?Exercise: Cardio daily ? ?Review of Systems ? ?   ?Past Medical History:  ?Diagnosis Date  ? History of chicken pox   ? ? ?Current Outpatient Medications  ?Medication Sig Dispense Refill  ? ibuprofen (ADVIL) 600 MG tablet Take 1 tablet (600 mg total) by mouth every 6 (six) hours as needed for cramping. 40 tablet 1  ? Nasal Dilators (BREATHE RIGHT) STRP 1 each by Does not apply route at bedtime.    ? omeprazole (PRILOSEC) 20 MG capsule Take 20 mg by mouth daily.    ? Prenatal Vit-Fe Fumarate-FA (PRENATAL MULTIVITAMIN) TABS tablet Take 1 tablet by mouth daily at 12 noon.    ? ?No current facility-administered medications for this visit.  ? ? ?No Known Allergies ? ?Family History  ?Problem Relation Age of Onset  ? Hypertension Mother   ? Hypertension Other   ?     Maternal Grandparent  ? Heart disease Other   ?     Grandparent  ? Alcohol abuse Other   ?     Grandparent  ? Stroke Other   ?     Other Blood Relative  ? ? ?Social History  ? ?Socioeconomic History  ? Marital status: Single  ?  Spouse name: Not on file  ? Number of children: 0  ? Years of education: 48  ? Highest education level: Not on file  ?Occupational History  ? Occupation: Production assistant, radio  ?  Employer: [employer]  ?Tobacco Use  ? Smoking status: Never  ? Smokeless tobacco: Never  ?Substance and Sexual Activity  ? Alcohol use: Yes  ?  Alcohol/week: 0.0 standard drinks  ?  Comment: social  ? Drug use: No  ? Sexual activity: Yes  ?Other Topics Concern  ? Not on file  ?Social History Narrative  ? Regular exercise-no  ? Caffeine Use-yes  ? ?Social Determinants of Health  ? ?Financial  Resource Strain: Not on file  ?Food Insecurity: Not on file  ?Transportation Needs: Not on file  ?Physical Activity: Not on file  ?Stress: Not on file  ?Social Connections: Not on file  ?Intimate Partner Violence: Not on file  ? ? ? ?Constitutional: Denies fever, malaise, fatigue, headache or abrupt weight changes.  ?HEENT: Denies eye pain, eye redness, ear pain, ringing in the ears, wax buildup, runny nose, nasal congestion, bloody nose, or sore throat. ?Respiratory: Denies difficulty breathing, shortness of breath, cough or sputum production.   ?Cardiovascular: Denies chest pain, chest tightness, palpitations or swelling in the hands or feet.  ?Gastrointestinal: Denies abdominal pain, bloating, constipation, diarrhea or blood in the stool.  ?GU: Patient reports vaginal discharge.  Denies urgency, frequency, pain with urination, burning sensation, blood in urine, odor or discharge. ?Musculoskeletal: Denies decrease in range of motion, difficulty with gait, muscle pain or joint pain and swelling.  ?Skin: Denies redness, rashes, lesions or ulcercations.  ?Neurological: Denies dizziness, difficulty with memory, difficulty with speech or problems with balance and coordination.  ?Psych: Denies anxiety, depression, SI/HI. ? ?No other specific complaints in a complete review of systems (except as listed in HPI above). ? ?  Objective:  ? Physical Exam ? ?BP 119/67 (BP Location: Left Arm, Patient Position: Sitting, Cuff Size: Normal)   Pulse (!) 57   Temp (!) 97.1 ?F (36.2 ?C) (Temporal)   Ht $R'5\' 2"'PJ$  (1.575 m)   Wt 131 lb (59.4 kg)   SpO2 100%   BMI 23.96 kg/m?  ? ?Wt Readings from Last 3 Encounters:  ?04/28/20 170 lb 12.8 oz (77.5 kg)  ?01/07/18 154 lb (69.9 kg)  ?04/05/17 143 lb (64.9 kg)  ? ? ?General: Appears her stated age, well developed, well nourished in NAD. ?Skin: Warm, dry and intact. ?HEENT: Head: normal shape and size; Eyes: sclera white and EOMs intact; ?Neck:  Neck supple, trachea midline. No masses, lumps  or thyromegaly present.  ?Cardiovascular: Normal rate and rhythm. S1,S2 noted.  No murmur, rubs or gallops noted. No JVD or BLE edema. ?Pulmonary/Chest: Normal effort and positive vesicular breath sounds. No respiratory distress. No wheezes, rales or ronchi noted.  ?Abdomen: Soft and nontender. Normal bowel sounds. No distention or masses noted. Liver, spleen and kidneys non palpable. ?Musculoskeletal: Strength 5/5 BUE/BLE.  No difficulty with gait.  ?Neurological: Alert and oriented. Cranial nerves II-XII grossly intact. Coordination normal.  ?Psychiatric: Mood and affect normal. Behavior is normal. Judgment and thought content normal.  ? ? ?BMET ?   ?Component Value Date/Time  ? NA 136 04/28/2020 0923  ? K 3.9 04/28/2020 0923  ? CL 108 04/28/2020 0923  ? CO2 20 (L) 04/28/2020 4540  ? GLUCOSE 82 04/28/2020 0923  ? BUN <5 (L) 04/28/2020 9811  ? CREATININE 0.61 04/28/2020 0923  ? CALCIUM 9.4 04/28/2020 0923  ? GFRNONAA >60 04/28/2020 0923  ? GFRAA  04/01/2008 1243  ?  >60        ?The eGFR has been calculated ?using the MDRD equation. ?This calculation has not been ?validated in all clinical ?situations. ?eGFR's persistently ?<60 mL/min signify ?possible Chronic Kidney Disease.  ? ? ?Lipid Panel  ?   ?Component Value Date/Time  ? CHOL 141 04/05/2017 0951  ? TRIG 81.0 04/05/2017 0951  ? HDL 55.10 04/05/2017 0951  ? CHOLHDL 3 04/05/2017 0951  ? VLDL 16.2 04/05/2017 0951  ? [place] 70 04/05/2017 0951  ? ? ?CBC ?   ?Component Value Date/Time  ? WBC 16.7 (H) 04/29/2020 9147  ? RBC 2.52 (L) 04/29/2020 8295  ? HGB 7.9 (L) 04/29/2020 0649  ? HCT 24.0 (L) 04/29/2020 0649  ? PLT 106 (L) 04/29/2020 0649  ? MCV 95.2 04/29/2020 0649  ? MCH 31.3 04/29/2020 0649  ? MCHC 32.9 04/29/2020 0649  ? RDW 14.0 04/29/2020 0649  ? LYMPHSABS 1.3 04/01/2008 1243  ? MONOABS 0.3 04/01/2008 1243  ? EOSABS 0.0 04/01/2008 1243  ? BASOSABS 0.0 04/01/2008 1243  ? ? ?Hgb A1C ?Lab Results  ?Component Value Date  ? HGBA1C 4.8 10/21/2015  ? ? ? ? ? ? ?    ?Assessment & Plan:  ? ?Preventative Health Maintenance: ? ?Encouraged her to get a flu shot in the fall ?Tetanus UTD per her report, will request copy  ?Encouraged her to get her covid vaccine ?Pap smear UTD per her report, will request copy encouraged her to consume a balanced diet and exercise regimen ?Advised her to see a dentist annually ?Will check CBC, CMET, Lipid and Hep C today ? ?Vaginal Discharge: ? ?We will check wet prep ? ?RTC in 1 year, sooner if needed ?Webb Silversmith, NP ?This visit occurred during the SARS-CoV-2 public health emergency.  Safety protocols were  in place, including screening questions prior to the visit, additional usage of staff PPE, and extensive cleaning of exam room while observing appropriate contact time as indicated for disinfecting solutions.  ? ?

## 2021-04-26 LAB — LIPID PANEL
Cholesterol: 144 mg/dL (ref <200)
HDL: 66 mg/dL (ref >=50)
LDL Cholesterol (Calc): 66 mg/dL (calc)
Non-HDL Cholesterol (Calc): 78 mg/dL (calc) (ref <130)
Total CHOL/HDL Ratio: 2.2 (calc) (ref <5.0)
Triglycerides: 41 mg/dL (ref <150)

## 2021-04-26 LAB — HEPATITIS C ANTIBODY
Hepatitis C Ab: NONREACTIVE
SIGNAL TO CUT-OFF: 0.07 (ref <1.00)

## 2021-04-26 LAB — COMPLETE METABOLIC PANEL WITH GFR
AG Ratio: 2 (calc) (ref 1.0–2.5)
ALT: 13 U/L (ref 6–29)
AST: 14 U/L (ref 10–30)
Albumin: 4.7 g/dL (ref 3.6–5.1)
Alkaline phosphatase (APISO): 50 U/L (ref 31–125)
BUN: 9 mg/dL (ref 7–25)
CO2: 25 mmol/L (ref 20–32)
Calcium: 9.9 mg/dL (ref 8.6–10.2)
Chloride: 106 mmol/L (ref 98–110)
Creat: 0.74 mg/dL (ref 0.50–0.97)
Globulin: 2.4 g/dL (calc) (ref 1.9–3.7)
Glucose, Bld: 79 mg/dL (ref 65–99)
Potassium: 4.1 mmol/L (ref 3.5–5.3)
Sodium: 139 mmol/L (ref 135–146)
Total Bilirubin: 1.1 mg/dL (ref 0.2–1.2)
Total Protein: 7.1 g/dL (ref 6.1–8.1)
eGFR: 110 mL/min/{1.73_m2} (ref >=60)

## 2021-04-26 LAB — CBC
HCT: 37.8 % (ref 35.0–45.0)
Hemoglobin: 12.3 g/dL (ref 11.7–15.5)
MCH: 29.9 pg (ref 27.0–33.0)
MCHC: 32.5 g/dL (ref 32.0–36.0)
MCV: 92 fL (ref 80.0–100.0)
MPV: 13.3 fL — ABNORMAL HIGH (ref 7.5–12.5)
Platelets: 154 10*3/uL (ref 140–400)
RBC: 4.11 10*6/uL (ref 3.80–5.10)
RDW: 12.4 % (ref 11.0–15.0)
WBC: 5.2 10*3/uL (ref 3.8–10.8)

## 2021-04-27 LAB — CERVICOVAGINAL ANCILLARY ONLY
Bacterial Vaginitis (gardnerella): POSITIVE — AB
Candida Glabrata: NEGATIVE
Candida Vaginitis: NEGATIVE
Chlamydia: NEGATIVE
Comment: NEGATIVE
Comment: NEGATIVE
Comment: NEGATIVE
Comment: NEGATIVE
Comment: NEGATIVE
Comment: NORMAL
Neisseria Gonorrhea: NEGATIVE
Trichomonas: NEGATIVE

## 2021-04-28 ENCOUNTER — Encounter: Payer: Self-pay | Admitting: Internal Medicine

## 2021-04-28 MED ORDER — METRONIDAZOLE 500 MG PO TABS: 500.0000 mg | ORAL_TABLET | Freq: Two times a day (BID) | ORAL | 0 refills | Status: AC

## 2021-07-05 ENCOUNTER — Encounter: Payer: Self-pay | Admitting: Internal Medicine

## 2021-07-11 ENCOUNTER — Encounter: Payer: Self-pay | Admitting: Family Medicine

## 2021-07-11 ENCOUNTER — Ambulatory Visit (INDEPENDENT_AMBULATORY_CARE_PROVIDER_SITE_OTHER): Payer: BC Managed Care – PPO | Admitting: Family Medicine

## 2021-07-11 VITALS — BP 122/70 | HR 65 | Ht 62.0 in | Wt 141.4 lb

## 2021-07-11 DIAGNOSIS — B9689 Other specified bacterial agents as the cause of diseases classified elsewhere: Secondary | ICD-10-CM

## 2021-07-11 DIAGNOSIS — N898 Other specified noninflammatory disorders of vagina: Secondary | ICD-10-CM

## 2021-07-11 DIAGNOSIS — N760 Acute vaginitis: Secondary | ICD-10-CM | POA: Diagnosis not present

## 2021-07-11 MED ORDER — METRONIDAZOLE 500 MG PO TABS: 500.0000 mg | ORAL_TABLET | Freq: Two times a day (BID) | ORAL | 1 refills | Status: DC

## 2021-07-11 NOTE — Progress Notes (Signed)
Subjective:    Patient ID: Margaret Calderon, female    DOB: 11-02-1990, 31 y.o.   MRN: [id number]  Margaret Calderon is a 31 y.o. female presenting on 07/11/2021 for Vaginal Discharge  PCP Margaret Silversmith, FNP  HPI  BV  04/25/21 Previous visit with Margaret Calderon, for annual physical and did pelvic exam with vaginal swab for BV, showed positive for BV and negative for other causes. She was treated with Metronidazole 500mg  twice a day for 7 days and it resolve.  She is sexually active. Partner uses condom. No other topicals or chemicals in this area.  Admits vaginal discharge No pain or burning no urinary symptoms.  Uses dove unscented soap . Not doing any cleansing products       04/25/2021    3:58 PM 05/21/2018   11:04 AM 04/05/2017    9:39 AM  Depression screen PHQ 2/9  Decreased Interest 0 0 0  Down, Depressed, Hopeless 0 0 0  PHQ - 2 Score 0 0 0    Social History   Tobacco Use   Smoking status: Never   Smokeless tobacco: Never  Vaping Use   Vaping Use: Never used  Substance Use Topics   Alcohol use: Yes    Alcohol/week: 0.0 standard drinks    Comment: social   Drug use: No    Review of Systems Per HPI unless specifically indicated above     Objective:    BP 122/70   Pulse 65   Ht 5\' 2"  (1.575 m)   Wt 141 lb 6.4 oz (64.1 kg)   SpO2 98%   BMI 25.86 kg/m   Wt Readings from Last 3 Encounters:  07/11/21 141 lb 6.4 oz (64.1 kg)  04/25/21 131 lb (59.4 kg)  04/28/20 170 lb 12.8 oz (77.5 kg)    Physical Exam Vitals and nursing note reviewed.  Constitutional:      General: She is not in acute distress.    Appearance: Normal appearance. She is well-developed. She is not diaphoretic.     Comments: Well-appearing, comfortable, cooperative  HENT:     Head: Normocephalic and atraumatic.  Eyes:     General:        Right eye: No discharge.        Left eye: No discharge.     Conjunctiva/sclera: Conjunctivae normal.  Cardiovascular:     Rate and Rhythm: Normal rate.   Pulmonary:     Effort: Pulmonary effort is normal.  Genitourinary:    Comments: Deferred pelvic exam was just done by PCP 04/2021 Skin:    General: Skin is warm and dry.     Findings: No erythema or rash.  Neurological:     Mental Status: She is alert and oriented to person, place, and time.  Psychiatric:        Mood and Affect: Mood normal.        Behavior: Behavior normal.        Thought Content: Thought content normal.     Comments: Well groomed, good eye contact, normal speech and thoughts   Results for orders placed or performed in visit on 04/25/21  CBC  Result Value Ref Range   WBC 5.2 3.8 - 10.8 Thousand/uL   RBC 4.11 3.80 - 5.10 Million/uL   Hemoglobin 12.3 11.7 - 15.5 g/dL   HCT 37.8 35.0 - 45.0 %   MCV 92.0 80.0 - 100.0 fL   MCH 29.9 27.0 - 33.0 pg   MCHC 32.5 32.0 - 36.0 g/dL  RDW 12.4 11.0 - 15.0 %   Platelets 154 140 - 400 Thousand/uL   MPV 13.3 (H) 7.5 - 12.5 fL  COMPLETE METABOLIC PANEL WITH GFR  Result Value Ref Range   Glucose, Bld 79 65 - 99 mg/dL   BUN 9 7 - 25 mg/dL   Creat 0.74 0.50 - 0.97 mg/dL   eGFR 110 > OR = 60 mL/min/1.67m2   BUN/Creatinine Ratio NOT APPLICABLE 6 - 22 (calc)   Sodium 139 135 - 146 mmol/L   Potassium 4.1 3.5 - 5.3 mmol/L   Chloride 106 98 - 110 mmol/L   CO2 25 20 - 32 mmol/L   Calcium 9.9 8.6 - 10.2 mg/dL   Total Protein 7.1 6.1 - 8.1 g/dL   Albumin 4.7 3.6 - 5.1 g/dL   Globulin 2.4 1.9 - 3.7 g/dL (calc)   AG Ratio 2.0 1.0 - 2.5 (calc)   Total Bilirubin 1.1 0.2 - 1.2 mg/dL   Alkaline phosphatase (APISO) 50 31 - 125 U/L   AST 14 10 - 30 U/L   ALT 13 6 - 29 U/L  Lipid panel  Result Value Ref Range   Cholesterol 144 <200 mg/dL   HDL 66 > OR = 50 mg/dL   Triglycerides 41 <150 mg/dL   LDL Cholesterol (Calc) 66 mg/dL (calc)   Total CHOL/HDL Ratio 2.2 <5.0 (calc)   Non-HDL Cholesterol (Calc) 78 <130 mg/dL (calc)  Hepatitis C antibody  Result Value Ref Range   Hepatitis C Ab NON-REACTIVE NON-REACTIVE   SIGNAL TO CUT-OFF  0.07 <1.00  Cervicovaginal ancillary only  Result Value Ref Range   Neisseria Gonorrhea Negative    Chlamydia Negative    Trichomonas Negative    Bacterial Vaginitis (gardnerella) Positive (A)    Candida Vaginitis Negative    Candida Glabrata Negative    Comment      Normal Reference Range Bacterial Vaginosis - Negative   Comment Normal Reference Ranger Chlamydia - Negative    Comment      Normal Reference Range Neisseria Gonorrhea - Negative   Comment Normal Reference Range Candida Species - Negative    Comment Normal Reference Range Candida Galbrata - Negative    Comment Normal Reference Range Trichomonas - Negative       Assessment & Plan:   Problem List Items Addressed This Visit   None Visit Diagnoses     BV (bacterial vaginosis)    -  Primary   Relevant Medications   metroNIDAZOLE (FLAGYL) 500 MG tablet   Vaginal discharge       Relevant Medications   metroNIDAZOLE (FLAGYL) 500 MG tablet       Clinically consistent with vaginal discharge on history Prior Confirmed with BV specimen 04/2021 Success with metronidazole  Plan: Re order Start Metronidazole 500mg  BID x 7 days (avoid alcohol) add 1 refill 2. Counseled on reducing recurrences with condom use, may try yogurt, probiotics, alternatively some patients are prone to recurrences based on vaginal environment  Future consider prevention Boric Acid Suppositories 600MG   #21 insert one vaginal at hs x 3 wks  She may return to her GYN if not improved  Meds ordered this encounter  Medications   metroNIDAZOLE (FLAGYL) 500 MG tablet    Sig: Take 1 tablet (500 mg total) by mouth 2 (two) times daily. Do not drink alcohol while taking this medicine. If needed may repeat course.    Dispense:  14 tablet    Refill:  1      Follow up plan:  Return if symptoms worsen or fail to improve.  Nobie Putnam, [facility] 07/11/2021, 2:44 PM

## 2021-07-11 NOTE — Patient Instructions (Addendum)
Thank you for coming to the office today.  1. For Bacterial Vaginosis (BV) take Metronidazole 500mg  twice daily for 7 days, do not drink alcohol on this med as it will cause nausea/vomiting. - This problem can be recurrent. Some people are more likely to get it than others, and it has to do with normal body chemistry and vaginal environment. One of the biggest risk factors for causing BV is unprotected sexual intercourse (without a condom), because semen can change the chemistry of your vaginal environment, causing the "good bacteria" reduce and the other bacteria to grow and cause your symptoms. Recommend to start using condoms with intercourse to see if this helps reduce your symptoms, do this especially for next 1 week while on treatment. Some other recommendations include taking a daily probiotic and yogurt can help reduce BV as well, this is not proven to work in all patients.  If still bothering you in future, and not resolving or cannot figure out what is causing it - then I would recommend next steps with GYN consultation in future.    1. Your symptoms sound consistent with a Vaginitis - irritation from likely BV or yeast. 2. Tested wet prep today looking for BV, Yeast, and Trichomonas - will call you with results. - If needed will send in Metronidazole 500mg  twice daily for 7 days (for either BV or Trich) no alcohol on this med. - If positive for Yeast - will send Diflucan 150mg  pill, take 1 and then on Day 3 take 2nd pill. - If both medicines sent, finish Metronidazole FIRST then take Diflucan. 3. Will call with results for Gonorrhea / Chlamydia swab in a few days  If tests negative, maybe normal symptoms following period. Unprotected intercourse can cause irritation and no infection sometimes. Make sure to try using condoms to see if reduces your symptoms. Probiotics and yogurt can help reduce BV as well.  Consider trial of OTC Boric Acid Suppositories 600MG   #21 insert one vaginal at hs  x 3 wks   Please schedule a Follow-up Appointment to: Return if symptoms worsen or fail to improve.  If you have any other questions or concerns, please feel free to call the office or send a message through MyChart. You may also schedule an earlier appointment if necessary.  Additionally, you may be receiving a survey about your experience at our office within a few days to 1 week by e-mail or mail. We value your feedback.  , DO [facility], 

## 2022-01-24 ENCOUNTER — Encounter: Payer: Self-pay | Admitting: Internal Medicine

## 2022-01-24 MED ORDER — MEDROXYPROGESTERONE ACETATE 150 MG/ML IM SUSP
150.0000 mg | INTRAMUSCULAR | 2 refills | Status: DC
Start: 2022-01-24 — End: 2023-03-23

## 2022-01-24 NOTE — Addendum Note (Signed)
Addended by: Lorre Munroe on: 01/24/2022 01:13 PM   Modules accepted: Orders

## 2022-02-01 ENCOUNTER — Encounter: Payer: Self-pay | Admitting: Internal Medicine

## 2022-03-16 LAB — HM PAP SMEAR: HPV, high-risk: NEGATIVE

## 2023-03-23 ENCOUNTER — Encounter: Payer: Self-pay | Admitting: Internal Medicine

## 2023-03-23 ENCOUNTER — Ambulatory Visit (INDEPENDENT_AMBULATORY_CARE_PROVIDER_SITE_OTHER): Payer: 59 | Admitting: Internal Medicine

## 2023-03-23 VITALS — BP 118/74 | Ht 62.0 in | Wt 158.2 lb

## 2023-03-23 DIAGNOSIS — Z136 Encounter for screening for cardiovascular disorders: Secondary | ICD-10-CM

## 2023-03-23 DIAGNOSIS — E663 Overweight: Secondary | ICD-10-CM | POA: Diagnosis not present

## 2023-03-23 DIAGNOSIS — R739 Hyperglycemia, unspecified: Secondary | ICD-10-CM | POA: Diagnosis not present

## 2023-03-23 DIAGNOSIS — Z6828 Body mass index (BMI) 28.0-28.9, adult: Secondary | ICD-10-CM | POA: Insufficient documentation

## 2023-03-23 DIAGNOSIS — Z0001 Encounter for general adult medical examination with abnormal findings: Secondary | ICD-10-CM | POA: Diagnosis not present

## 2023-03-23 MED ORDER — NORETHIN ACE-ETH ESTRAD-FE 1-20 MG-MCG PO TABS: 1.0000 | ORAL_TABLET | Freq: Every day | ORAL | 3 refills | Status: AC

## 2023-03-23 NOTE — Progress Notes (Signed)
Subjective:    Patient ID: Margaret Calderon, female    DOB: Dec 09, 1990, 33 y.o.   MRN: [id number]  HPI  Pt presents to the clinic today for her annual exam.  Flu: 11/2020 Tetanus: 2009 Covid: Pfizer x 2 Pap smear: 10/2015 Dentist: biannually  Diet: She does eat meat. She consumes fruits and veggies. She tries to avoid fried foods. She drinks mostly water. Exercise: Cardio daily  Review of Systems     Past Medical History:  Diagnosis Date   History of chicken pox     Current Outpatient Medications  Medication Sig Dispense Refill   medroxyPROGESTERone (DEPO-PROVERA) 150 MG/ML injection Inject 1 mL (150 mg total) into the muscle every 3 (three) months. 1 mL 2   metroNIDAZOLE (FLAGYL) 500 MG tablet Take 1 tablet (500 mg total) by mouth 2 (two) times daily. Do not drink alcohol while taking this medicine. If needed may repeat course. 14 tablet 1   No current facility-administered medications for this visit.    No Known Allergies  Family History  Problem Relation Age of Onset   Hypertension Mother    Hypertension Other        Maternal Grandparent   Heart disease Other        Grandparent   Alcohol abuse Other        Grandparent   Stroke Other        Other Blood Relative   Colon cancer Neg Hx    Breast cancer Neg Hx     Social History   Socioeconomic History   Marital status: Single    Spouse name: Not on file   Number of children: 0   Years of education: 16   Highest education level: Not on file  Occupational History   Occupation: Lexicographer: [employer]  Tobacco Use   Smoking status: Never   Smokeless tobacco: Never  Vaping Use   Vaping status: Never Used  Substance and Sexual Activity   Alcohol use: Yes    Alcohol/week: 0.0 standard drinks of alcohol    Comment: social   Drug use: No   Sexual activity: Yes  Other Topics Concern   Not on file  Social History Narrative   Regular exercise-no   Caffeine Use-yes   Social Drivers of  Health   Financial Resource Strain: Not on file  Food Insecurity: Not on file  Transportation Needs: Not on file  Physical Activity: Not on file  Stress: Not on file  Social Connections: Not on file  Intimate Partner Violence: Not on file     Constitutional: Denies fever, malaise, fatigue, headache or abrupt weight changes.  HEENT: Denies eye pain, eye redness, ear pain, ringing in the ears, wax buildup, runny nose, nasal congestion, bloody nose, or sore throat. Respiratory: Denies difficulty breathing, shortness of breath, cough or sputum production.   Cardiovascular: Denies chest pain, chest tightness, palpitations or swelling in the hands or feet.  Gastrointestinal: Denies abdominal pain, bloating, constipation, diarrhea or blood in the stool.  GU:  Denies urgency, frequency, pain with urination, burning sensation, blood in urine, odor or discharge. Musculoskeletal: Denies decrease in range of motion, difficulty with gait, muscle pain or joint pain and swelling.  Skin: Denies redness, rashes, lesions or ulcercations.  Neurological: Denies dizziness, difficulty with memory, difficulty with speech or problems with balance and coordination.  Psych: Denies anxiety, depression, SI/HI.  No other specific complaints in a complete review of systems (except as listed in HPI above).  Objective:   Physical Exam  BP 118/74 (BP Location: Left Arm, Patient Position: Sitting, Cuff Size: Normal)   Ht 5\' 2"  (1.575 m)   Wt 158 lb 3.2 oz (71.8 kg)   LMP 03/16/2023 (Approximate)   BMI 28.94 kg/m    Wt Readings from Last 3 Encounters:  07/11/21 141 lb 6.4 oz (64.1 kg)  04/25/21 131 lb (59.4 kg)  04/28/20 170 lb 12.8 oz (77.5 kg)    General: Appears her stated age, well developed, well nourished in NAD. Skin: Warm, dry and intact. HEENT: Head: normal shape and size; Eyes: sclera white and EOMs intact; Neck:  Neck supple, trachea midline. No masses, lumps or thyromegaly present.   Cardiovascular: Normal rate and rhythm. S1,S2 noted.  No murmur, rubs or gallops noted. No JVD or BLE edema. Pulmonary/Chest: Normal effort and positive vesicular breath sounds. No respiratory distress. No wheezes, rales or ronchi noted.  Abdomen: Soft and nontender. Normal bowel sounds. No distention or masses noted. Liver, spleen and kidneys non palpable. Musculoskeletal: Strength 5/5 BUE/BLE.  No difficulty with gait.  Neurological: Alert and oriented. Cranial nerves II-XII grossly intact. Coordination normal.  Psychiatric: Mood and affect normal. Behavior is normal. Judgment and thought content normal.    BMET    Component Value Date/Time   NA 139 04/25/2021 1601   K 4.1 04/25/2021 1601   CL 106 04/25/2021 1601   CO2 25 04/25/2021 1601   GLUCOSE 79 04/25/2021 1601   BUN 9 04/25/2021 1601   CREATININE 0.74 04/25/2021 1601   CALCIUM 9.9 04/25/2021 1601   GFRNONAA >60 04/28/2020 0923   GFRAA  04/01/2008 1243    >60        The eGFR has been calculated using the MDRD equation. This calculation has not been validated in all clinical situations. eGFR's persistently <60 mL/min signify possible Chronic Kidney Disease.    Lipid Panel     Component Value Date/Time   CHOL 144 04/25/2021 1601   TRIG 41 04/25/2021 1601   HDL 66 04/25/2021 1601   CHOLHDL 2.2 04/25/2021 1601   VLDL 16.2 04/05/2017 0951   LDLCALC 66 04/25/2021 1601    CBC    Component Value Date/Time   WBC 5.2 04/25/2021 1601   RBC 4.11 04/25/2021 1601   HGB 12.3 04/25/2021 1601   HCT 37.8 04/25/2021 1601   PLT 154 04/25/2021 1601   MCV 92.0 04/25/2021 1601   MCH 29.9 04/25/2021 1601   MCHC 32.5 04/25/2021 1601   RDW 12.4 04/25/2021 1601   LYMPHSABS 1.3 04/01/2008 1243   MONOABS 0.3 04/01/2008 1243   EOSABS 0.0 04/01/2008 1243   BASOSABS 0.0 04/01/2008 1243    Hgb A1C Lab Results  Component Value Date   HGBA1C 4.8 10/21/2015           Assessment & Plan:   Preventative Health  Maintenance:  Flu shot declined Tetanus declined today Encouraged her to get her covid vaccine Pap smear UTD per her report, will request copy Advised her to see a dentist annually Will check CBC, CMET, Lipid and A1C today   RTC in 1 year, sooner if needed Nicki Reaper, NP

## 2023-03-23 NOTE — Assessment & Plan Note (Signed)
Encouraged diet and exercise for weight loss ?

## 2023-03-23 NOTE — Patient Instructions (Signed)

## 2023-03-24 LAB — HEMOGLOBIN A1C
Hgb A1c MFr Bld: 4.9 %{Hb} (ref <5.7)
Mean Plasma Glucose: 94 mg/dL
eAG (mmol/L): 5.2 mmol/L

## 2023-03-24 LAB — CBC
HCT: 41.9 % (ref 35.0–45.0)
Hemoglobin: 13.9 g/dL (ref 11.7–15.5)
MCH: 31.3 pg (ref 27.0–33.0)
MCHC: 33.2 g/dL (ref 32.0–36.0)
MCV: 94.4 fL (ref 80.0–100.0)
MPV: 13.2 fL — ABNORMAL HIGH (ref 7.5–12.5)
Platelets: 161 10*3/uL (ref 140–400)
RBC: 4.44 10*6/uL (ref 3.80–5.10)
RDW: 11.9 % (ref 11.0–15.0)
WBC: 7.1 10*3/uL (ref 3.8–10.8)

## 2023-03-24 LAB — COMPLETE METABOLIC PANEL WITH GFR
AG Ratio: 1.7 (calc) (ref 1.0–2.5)
ALT: 70 U/L — ABNORMAL HIGH (ref 6–29)
AST: 68 U/L — ABNORMAL HIGH (ref 10–30)
Albumin: 4.6 g/dL (ref 3.6–5.1)
Alkaline phosphatase (APISO): 45 U/L (ref 31–125)
BUN: 9 mg/dL (ref 7–25)
CO2: 29 mmol/L (ref 20–32)
Calcium: 9.9 mg/dL (ref 8.6–10.2)
Chloride: 103 mmol/L (ref 98–110)
Creat: 0.75 mg/dL (ref 0.50–0.97)
Globulin: 2.7 g/dL (ref 1.9–3.7)
Glucose, Bld: 87 mg/dL (ref 65–99)
Potassium: 4.7 mmol/L (ref 3.5–5.3)
Sodium: 140 mmol/L (ref 135–146)
Total Bilirubin: 0.5 mg/dL (ref 0.2–1.2)
Total Protein: 7.3 g/dL (ref 6.1–8.1)
eGFR: 108 mL/min/{1.73_m2} (ref >=60)

## 2023-03-24 LAB — LIPID PANEL
Cholesterol: 146 mg/dL (ref <200)
HDL: 62 mg/dL (ref >=50)
LDL Cholesterol (Calc): 71 mg/dL
Non-HDL Cholesterol (Calc): 84 mg/dL (ref <130)
Total CHOL/HDL Ratio: 2.4 (calc) (ref <5.0)
Triglycerides: 47 mg/dL (ref <150)

## 2023-03-26 ENCOUNTER — Encounter: Payer: Self-pay | Admitting: Internal Medicine

## 2023-05-03 ENCOUNTER — Encounter: Payer: Self-pay | Admitting: Internal Medicine

## 2023-06-18 ENCOUNTER — Telehealth: Payer: Self-pay

## 2023-06-18 NOTE — Telephone Encounter (Signed)
 Copied from CRM [id number]. Topic: Appointments - Appointment Scheduling >> Jun 18, 2023  9:07 AM Emylou G wrote: F/u for bp lipid panels.. Please advise.. does she need to be seen?  Or can we order labs.

## 2023-06-20 ENCOUNTER — Telehealth: Payer: Self-pay

## 2023-06-20 DIAGNOSIS — R748 Abnormal levels of other serum enzymes: Secondary | ICD-10-CM

## 2023-06-20 NOTE — Addendum Note (Signed)
 Addended by: Carollynn Cirri on: 06/20/2023 11:54 AM   Modules accepted: Orders

## 2023-06-20 NOTE — Telephone Encounter (Signed)
 Patient has a lab appt on Friday.Just need orders. Or let me know what to order and I'll take care of it.

## 2023-06-20 NOTE — Telephone Encounter (Signed)
Liver enzymes ordered

## 2023-06-22 ENCOUNTER — Other Ambulatory Visit

## 2023-06-22 DIAGNOSIS — R748 Abnormal levels of other serum enzymes: Secondary | ICD-10-CM

## 2023-06-23 LAB — HEPATIC FUNCTION PANEL
AG Ratio: 1.4 (calc) (ref 1.0–2.5)
ALT: 12 U/L (ref 6–29)
AST: 12 U/L (ref 10–30)
Albumin: 3.9 g/dL (ref 3.6–5.1)
Alkaline phosphatase (APISO): 48 U/L (ref 31–125)
Bilirubin, Direct: 0.1 mg/dL (ref 0.0–0.2)
Globulin: 2.7 g/dL (ref 1.9–3.7)
Indirect Bilirubin: 0.3 mg/dL (ref 0.2–1.2)
Total Bilirubin: 0.4 mg/dL (ref 0.2–1.2)
Total Protein: 6.6 g/dL (ref 6.1–8.1)

## 2023-06-25 ENCOUNTER — Ambulatory Visit: Payer: Self-pay | Admitting: Internal Medicine

## 2024-03-28 ENCOUNTER — Ambulatory Visit: Payer: 59 | Admitting: Internal Medicine
